# Patient Record
Sex: Male | Born: 1950 | Race: White | Hispanic: No | Marital: Single | Smoking: Former smoker
Health system: Southern US, Community
[De-identification: ages and names within clinical notes are randomized; demographics above are authoritative.]

## PROBLEM LIST (undated history)

## (undated) DIAGNOSIS — I441 Atrioventricular block, second degree: Secondary | ICD-10-CM

## (undated) DIAGNOSIS — E119 Type 2 diabetes mellitus without complications: Secondary | ICD-10-CM

## (undated) DIAGNOSIS — G3184 Mild cognitive impairment, so stated: Secondary | ICD-10-CM

## (undated) DIAGNOSIS — I442 Atrioventricular block, complete: Secondary | ICD-10-CM

## (undated) DIAGNOSIS — E785 Hyperlipidemia, unspecified: Secondary | ICD-10-CM

## (undated) DIAGNOSIS — I471 Supraventricular tachycardia, unspecified: Secondary | ICD-10-CM

## (undated) DIAGNOSIS — I252 Old myocardial infarction: Secondary | ICD-10-CM

## (undated) HISTORY — DX: Old myocardial infarction: I25.2

## (undated) HISTORY — DX: Type 2 diabetes mellitus without complications: E11.9

## (undated) HISTORY — DX: Hyperlipidemia, unspecified: E78.5

## (undated) HISTORY — DX: Atrioventricular block, complete: I44.2

## (undated) HISTORY — DX: Mild cognitive impairment of uncertain or unknown etiology: G31.84

## (undated) HISTORY — DX: Supraventricular tachycardia: I47.1

## (undated) HISTORY — DX: Atrioventricular block, second degree: I44.1

## (undated) HISTORY — DX: Supraventricular tachycardia, unspecified: I47.10

---

## 2006-09-30 DIAGNOSIS — I252 Old myocardial infarction: Secondary | ICD-10-CM

## 2006-09-30 HISTORY — PX: CORONARY ARTERY BYPASS GRAFT: SHX141

## 2006-09-30 HISTORY — DX: Old myocardial infarction: I25.2

## 2006-10-03 ENCOUNTER — Ambulatory Visit: Payer: Self-pay | Admitting: Thoracic Surgery (Cardiothoracic Vascular Surgery)

## 2006-10-03 ENCOUNTER — Inpatient Hospital Stay (HOSPITAL_COMMUNITY): Admission: EM | Admit: 2006-10-03 | Discharge: 2006-10-08 | Payer: Self-pay | Admitting: Emergency Medicine

## 2006-10-03 ENCOUNTER — Ambulatory Visit: Payer: Self-pay | Admitting: Cardiovascular Disease

## 2006-10-03 DIAGNOSIS — I25810 Atherosclerosis of coronary artery bypass graft(s) without angina pectoris: Secondary | ICD-10-CM | POA: Insufficient documentation

## 2006-10-06 ENCOUNTER — Encounter: Payer: Self-pay | Admitting: Cardiovascular Disease

## 2006-10-29 ENCOUNTER — Ambulatory Visit: Payer: Self-pay | Admitting: Cardiovascular Disease

## 2006-11-03 ENCOUNTER — Ambulatory Visit: Payer: Self-pay | Admitting: Thoracic Surgery (Cardiothoracic Vascular Surgery)

## 2007-01-05 ENCOUNTER — Ambulatory Visit: Payer: Self-pay | Admitting: Cardiovascular Disease

## 2007-01-05 LAB — CONVERTED CEMR LAB
ALT: 33 units/L (ref 0–53)
AST: 25 units/L (ref 0–37)
Alkaline Phosphatase: 83 units/L (ref 39–117)
Bilirubin, Direct: 0.1 mg/dL (ref 0.0–0.3)
Cholesterol: 199 mg/dL (ref 0–200)
HDL: 26.3 mg/dL — ABNORMAL LOW (ref >39.0)
VLDL: 36 mg/dL (ref 0–40)

## 2007-01-13 ENCOUNTER — Ambulatory Visit: Payer: Self-pay | Admitting: Cardiovascular Disease

## 2007-04-09 ENCOUNTER — Ambulatory Visit: Payer: Self-pay | Admitting: Cardiovascular Disease

## 2007-04-09 LAB — CONVERTED CEMR LAB
Albumin: 4.3 g/dL (ref 3.5–5.2)
Alkaline Phosphatase: 61 units/L (ref 39–117)
Total Bilirubin: 0.9 mg/dL (ref 0.3–1.2)
Total CHOL/HDL Ratio: 5.7
Triglycerides: 166 mg/dL — ABNORMAL HIGH (ref 0–149)
VLDL: 33 mg/dL (ref 0–40)

## 2007-04-15 ENCOUNTER — Ambulatory Visit: Payer: Self-pay | Admitting: Cardiology

## 2007-07-14 ENCOUNTER — Ambulatory Visit: Payer: Self-pay | Admitting: Cardiovascular Disease

## 2007-07-22 ENCOUNTER — Ambulatory Visit: Payer: Self-pay

## 2008-03-17 IMAGING — CR DG CHEST 2V
2 series · 2 of 2 positions shown · non-contrast
Comparison: 10/06/06.

CLINICAL DATA: 53-year-old with chest pain and shortness of breath.
 CHEST - 2 VIEW:

[w chest pa]
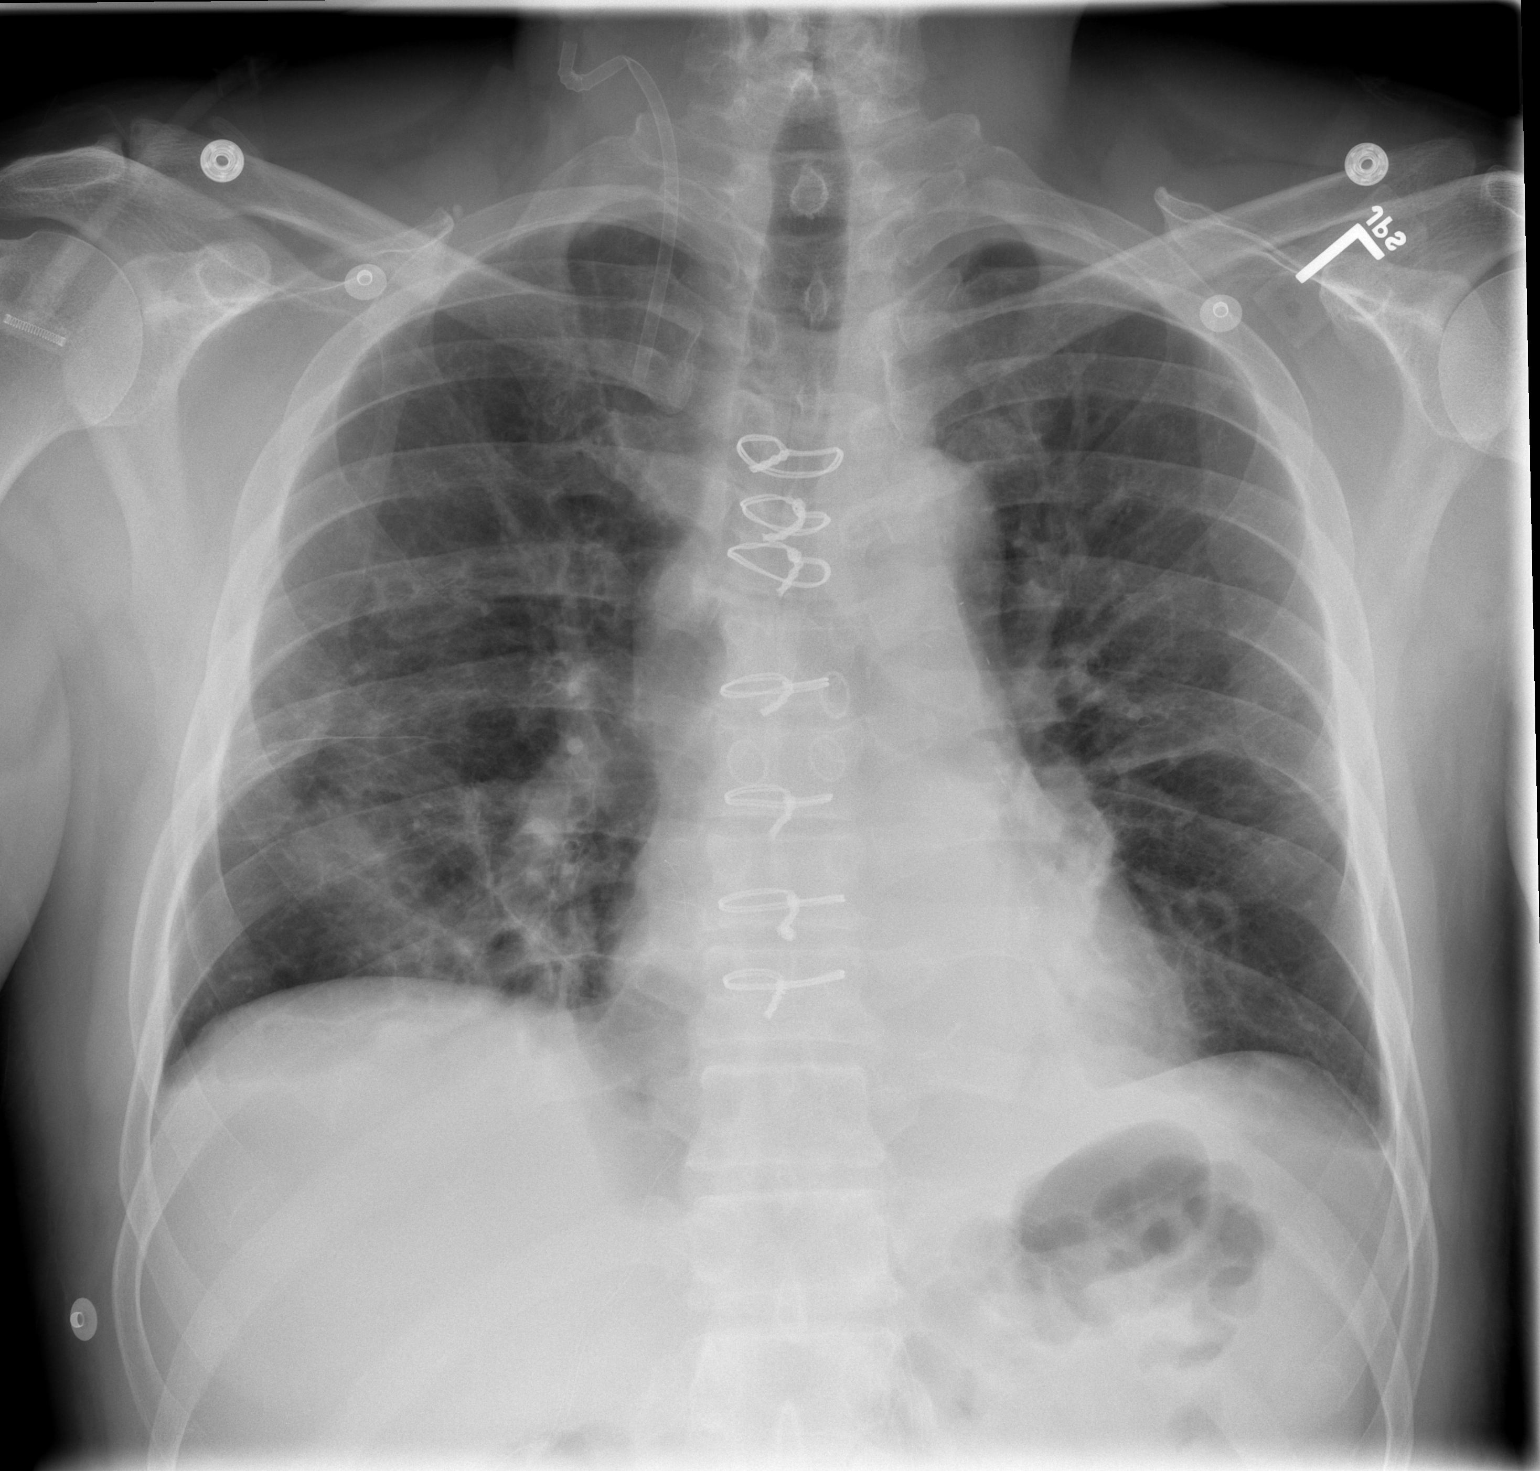

[w chest lat]
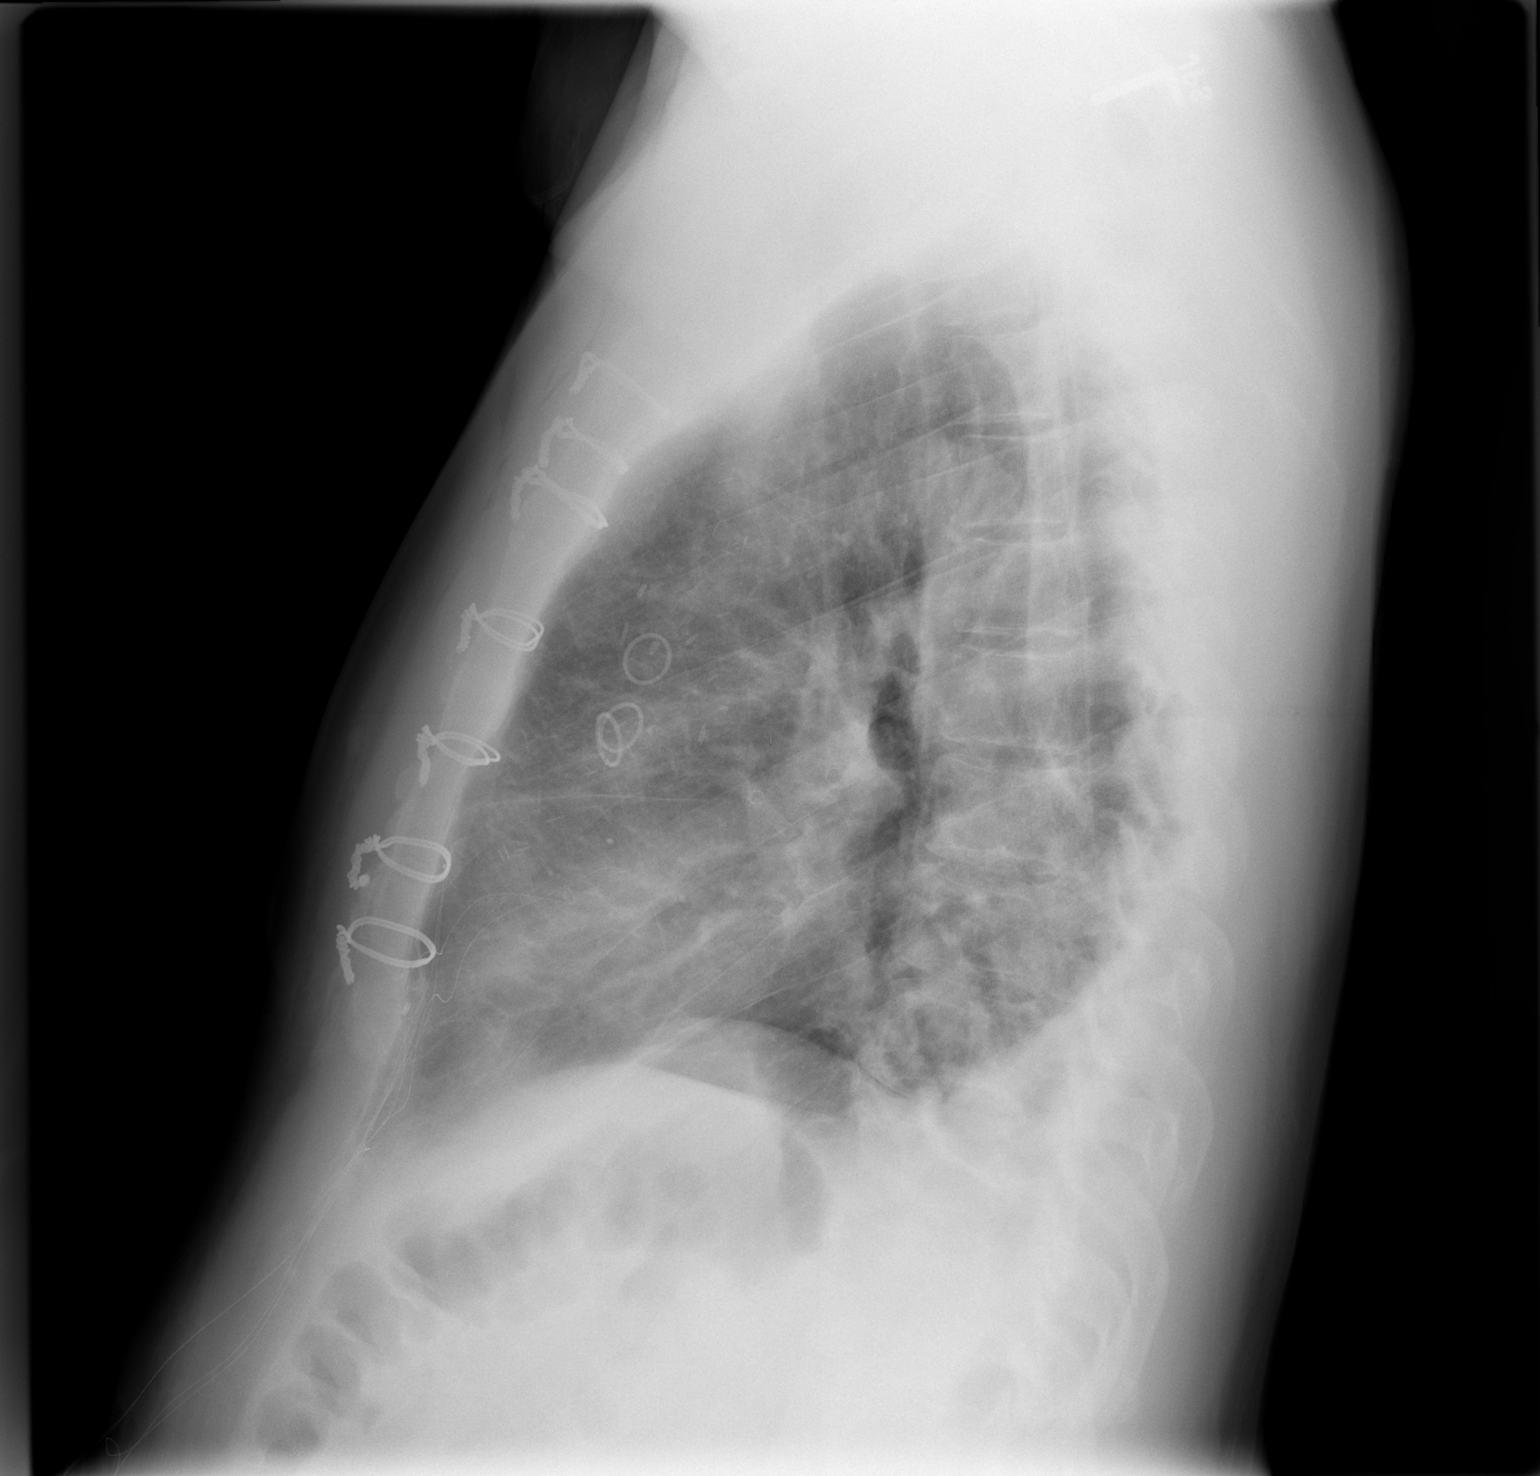

[2 of 2 positions shown; findings below may reference images not displayed]

FINDINGS: Stable surgical change related to bypass surgery.  The right IJ cordis is still in place.  Lungs demonstrate improved aeration with resolving edema and atelectasis.  Small bilateral pleural effusions persist.
IMPRESSION: 1.  Overall, improved aeration. 
 2.  No pneumothorax. 
 3.  Small bilateral effusions.

## 2008-04-04 ENCOUNTER — Ambulatory Visit: Payer: Self-pay | Admitting: Cardiovascular Disease

## 2008-04-11 ENCOUNTER — Ambulatory Visit: Payer: Self-pay | Admitting: Cardiovascular Disease

## 2008-04-11 LAB — CONVERTED CEMR LAB
ALT: 49 units/L (ref 0–53)
Alkaline Phosphatase: 45 units/L (ref 39–117)
Basophils Absolute: 0 10*3/uL (ref 0.0–0.1)
Bilirubin, Direct: 0.2 mg/dL (ref 0.0–0.3)
CO2: 28 meq/L (ref 19–32)
Calcium: 9.1 mg/dL (ref 8.4–10.5)
Cholesterol: 138 mg/dL (ref 0–200)
Glucose, Bld: 126 mg/dL — ABNORMAL HIGH (ref 70–99)
HDL: 20.3 mg/dL — ABNORMAL LOW (ref >39.0)
Hemoglobin: 16.1 g/dL (ref 13.0–17.0)
LDL Cholesterol: 79 mg/dL (ref 0–99)
Lymphocytes Relative: 38.8 % (ref 12.0–46.0)
MCHC: 34.7 g/dL (ref 30.0–36.0)
Monocytes Relative: 14.9 % — ABNORMAL HIGH (ref 3.0–12.0)
Neutro Abs: 2.1 10*3/uL (ref 1.4–7.7)
Neutrophils Relative %: 42.8 % — ABNORMAL LOW (ref 43.0–77.0)
Platelets: 160 10*3/uL (ref 150–400)
Potassium: 4.4 meq/L (ref 3.5–5.1)
RDW: 12.8 % (ref 11.5–14.6)
Sodium: 141 meq/L (ref 135–145)
Total Bilirubin: 0.9 mg/dL (ref 0.3–1.2)
Total CHOL/HDL Ratio: 6.8
Total Protein: 7.1 g/dL (ref 6.0–8.3)
Triglycerides: 194 mg/dL — ABNORMAL HIGH (ref 0–149)
VLDL: 39 mg/dL (ref 0–40)

## 2008-08-08 DIAGNOSIS — I471 Supraventricular tachycardia: Secondary | ICD-10-CM

## 2008-08-08 DIAGNOSIS — E785 Hyperlipidemia, unspecified: Secondary | ICD-10-CM | POA: Insufficient documentation

## 2008-10-27 ENCOUNTER — Telehealth: Payer: Self-pay | Admitting: Cardiovascular Disease

## 2008-12-28 ENCOUNTER — Ambulatory Visit: Payer: Self-pay | Admitting: Cardiovascular Disease

## 2008-12-28 DIAGNOSIS — G3184 Mild cognitive impairment, so stated: Secondary | ICD-10-CM | POA: Insufficient documentation

## 2009-05-04 ENCOUNTER — Telehealth (INDEPENDENT_AMBULATORY_CARE_PROVIDER_SITE_OTHER): Payer: Self-pay | Admitting: *Deleted

## 2009-08-23 ENCOUNTER — Ambulatory Visit: Payer: Self-pay | Admitting: Cardiovascular Disease

## 2009-08-29 LAB — CONVERTED CEMR LAB
ALT: 56 units/L — ABNORMAL HIGH (ref 0–53)
Albumin: 4.3 g/dL (ref 3.5–5.2)
Alkaline Phosphatase: 46 units/L (ref 39–117)
Bilirubin, Direct: 0.1 mg/dL (ref 0.0–0.3)
Total CHOL/HDL Ratio: 4
Total Protein: 7.4 g/dL (ref 6.0–8.3)
Triglycerides: 194 mg/dL — ABNORMAL HIGH (ref 0.0–149.0)

## 2009-09-04 ENCOUNTER — Ambulatory Visit: Payer: Self-pay | Admitting: Cardiovascular Disease

## 2009-09-13 ENCOUNTER — Telehealth (INDEPENDENT_AMBULATORY_CARE_PROVIDER_SITE_OTHER): Payer: Self-pay | Admitting: *Deleted

## 2009-09-18 ENCOUNTER — Ambulatory Visit: Payer: Self-pay

## 2009-09-18 ENCOUNTER — Ambulatory Visit: Payer: Self-pay | Admitting: Internal Medicine

## 2009-09-18 ENCOUNTER — Encounter (HOSPITAL_COMMUNITY): Admission: RE | Admit: 2009-09-18 | Discharge: 2009-11-29 | Payer: Self-pay | Admitting: Cardiovascular Disease

## 2010-03-28 ENCOUNTER — Telehealth: Payer: Self-pay | Admitting: Cardiovascular Disease

## 2010-04-27 ENCOUNTER — Ambulatory Visit: Payer: Self-pay | Admitting: Cardiovascular Disease

## 2010-04-27 LAB — CONVERTED CEMR LAB
AST: 25 units/L (ref 0–37)
Albumin: 4.2 g/dL (ref 3.5–5.2)
Alkaline Phosphatase: 49 units/L (ref 39–117)
Bilirubin, Direct: 0.1 mg/dL (ref 0.0–0.3)
Cholesterol: 117 mg/dL (ref 0–200)
HDL: 23.4 mg/dL — ABNORMAL LOW (ref >39.00)
LDL Cholesterol: 54 mg/dL (ref 0–99)
Total Bilirubin: 1.1 mg/dL (ref 0.3–1.2)
Total CHOL/HDL Ratio: 5
VLDL: 39.6 mg/dL (ref 0.0–40.0)

## 2010-05-03 ENCOUNTER — Encounter: Payer: Self-pay | Admitting: Cardiovascular Disease

## 2010-05-03 ENCOUNTER — Ambulatory Visit: Payer: Self-pay | Admitting: Cardiovascular Disease

## 2010-07-30 ENCOUNTER — Telehealth: Payer: Self-pay | Admitting: Cardiovascular Disease

## 2010-07-30 ENCOUNTER — Inpatient Hospital Stay (HOSPITAL_COMMUNITY)
Admission: EM | Admit: 2010-07-30 | Discharge: 2010-08-01 | DRG: 243 | Disposition: A | Discharge status: Home / Self Care | Payer: Self-pay | Attending: Cardiovascular Disease | Admitting: Cardiovascular Disease

## 2010-07-30 DIAGNOSIS — E785 Hyperlipidemia, unspecified: Secondary | ICD-10-CM | POA: Diagnosis present

## 2010-07-30 DIAGNOSIS — I252 Old myocardial infarction: Secondary | ICD-10-CM

## 2010-07-30 DIAGNOSIS — I2510 Atherosclerotic heart disease of native coronary artery without angina pectoris: Secondary | ICD-10-CM | POA: Diagnosis present

## 2010-07-30 DIAGNOSIS — Z8674 Personal history of sudden cardiac arrest: Secondary | ICD-10-CM

## 2010-07-30 DIAGNOSIS — I441 Atrioventricular block, second degree: Principal | ICD-10-CM | POA: Diagnosis present

## 2010-07-30 DIAGNOSIS — I442 Atrioventricular block, complete: Secondary | ICD-10-CM | POA: Diagnosis present

## 2010-07-30 DIAGNOSIS — E119 Type 2 diabetes mellitus without complications: Secondary | ICD-10-CM | POA: Diagnosis present

## 2010-07-30 DIAGNOSIS — I471 Supraventricular tachycardia, unspecified: Secondary | ICD-10-CM | POA: Diagnosis present

## 2010-07-30 DIAGNOSIS — I498 Other specified cardiac arrhythmias: Secondary | ICD-10-CM | POA: Diagnosis present

## 2010-07-30 DIAGNOSIS — Z87891 Personal history of nicotine dependence: Secondary | ICD-10-CM

## 2010-07-30 DIAGNOSIS — I200 Unstable angina: Secondary | ICD-10-CM | POA: Diagnosis present

## 2010-07-30 DIAGNOSIS — Z91038 Other insect allergy status: Secondary | ICD-10-CM

## 2010-07-30 DIAGNOSIS — Z951 Presence of aortocoronary bypass graft: Secondary | ICD-10-CM

## 2010-07-30 DIAGNOSIS — Z7982 Long term (current) use of aspirin: Secondary | ICD-10-CM

## 2010-07-30 DIAGNOSIS — I452 Bifascicular block: Secondary | ICD-10-CM | POA: Diagnosis present

## 2010-07-30 DIAGNOSIS — I10 Essential (primary) hypertension: Secondary | ICD-10-CM | POA: Diagnosis present

## 2010-07-30 LAB — BASIC METABOLIC PANEL
BUN: 16 mg/dL (ref 6–23)
Calcium: 10 mg/dL (ref 8.4–10.5)
Creatinine, Ser: 1.07 mg/dL (ref 0.4–1.5)

## 2010-07-30 LAB — CBC
HCT: 48.9 % (ref 39.0–52.0)
MCH: 30.3 pg (ref 26.0–34.0)
MCHC: 35.8 g/dL (ref 30.0–36.0)
MCV: 84.6 fL (ref 78.0–100.0)
Platelets: 135 10*3/uL — ABNORMAL LOW (ref 150–400)
RDW: 12.6 % (ref 11.5–15.5)

## 2010-07-30 LAB — TROPONIN I

## 2010-07-30 LAB — DIFFERENTIAL
Eosinophils Absolute: 0.1 10*3/uL (ref 0.0–0.7)
Eosinophils Relative: 2 % (ref 0–5)
Lymphs Abs: 1.6 10*3/uL (ref 0.7–4.0)
Monocytes Absolute: 0.6 10*3/uL (ref 0.1–1.0)
Monocytes Relative: 13 % — ABNORMAL HIGH (ref 3–12)

## 2010-07-30 LAB — PROTIME-INR: Prothrombin Time: 15.2 seconds (ref 11.6–15.2)

## 2010-07-30 LAB — HEMOGLOBIN A1C: Mean Plasma Glucose: 335 mg/dL — ABNORMAL HIGH (ref <117)

## 2010-07-30 LAB — COMPREHENSIVE METABOLIC PANEL
AST: 37 U/L (ref 0–37)
Alkaline Phosphatase: 56 U/L (ref 39–117)
BUN: 15 mg/dL (ref 6–23)
BUN: 14 mg/dL (ref 6–23)
CO2: 27 mEq/L (ref 19–32)
CO2: 27 mEq/L (ref 19–32)
Calcium: 9.6 mg/dL (ref 8.4–10.5)
Calcium: 9.8 mg/dL (ref 8.4–10.5)
Chloride: 96 mEq/L (ref 96–112)
Creatinine, Ser: 1.11 mg/dL (ref 0.4–1.5)
GFR calc non Af Amer: >60 mL/min (ref >60)
GFR calc non Af Amer: >60 mL/min (ref >60)
Glucose, Bld: 315 mg/dL — ABNORMAL HIGH (ref 70–99)
Total Bilirubin: 0.7 mg/dL (ref 0.3–1.2)
Total Protein: 7 g/dL (ref 6.0–8.3)

## 2010-07-30 LAB — CK TOTAL AND CKMB (NOT AT ARMC): Total CK: 311 U/L — ABNORMAL HIGH (ref 7–232)

## 2010-07-30 LAB — TSH: TSH: 2.162 u[IU]/mL (ref 0.350–4.500)

## 2010-07-31 HISTORY — PX: PACEMAKER INSERTION: SHX728

## 2010-07-31 LAB — GLUCOSE, CAPILLARY: Glucose-Capillary: 321 mg/dL — ABNORMAL HIGH (ref 70–99)

## 2010-07-31 LAB — LIPID PANEL
HDL: 24 mg/dL — ABNORMAL LOW (ref >39)
Total CHOL/HDL Ratio: 5.5 RATIO
Triglycerides: 342 mg/dL — ABNORMAL HIGH (ref <150)
VLDL: 68 mg/dL — ABNORMAL HIGH (ref 0–40)

## 2010-07-31 LAB — CARDIAC PANEL(CRET KIN+CKTOT+MB+TROPI)

## 2010-07-31 NOTE — Assessment & Plan Note (Signed)
Summary: f3m   Visit Type:  6 months follow up Primary Provider:  none  CC:  Chest pains-Sob-Heart flutter- Tiredness- Bilateral leg pain.  History of Present Illness: 60 year-old male with hx CAD s/p CABG following an acute MI in 2008.  He has several compliants today, but overall he has not felt as well lately and feels a slow decline over the past several months. He is having a great deal of difficulty with sleep - typically sleeping only a few hours at a time. Also complains of shortness of breath with activity.l Denies edema, orthopnea, or PND. No chest pain. does c/o some palpitations.   Current Medications (verified): 1)  Metoprolol Succinate 50 Mg Xr24h-Tab (Metoprolol Succinate) .... Take 1 Tablet By Mouth Once  A Day 2)  Simvastatin 40 Mg Tabs (Simvastatin) .... Take 1 Tablet By Mouth At Bedtime 3)  Aspirin Ec 325 Mg Tbec (Aspirin) .... Take One Tablet By Mouth Daily 4)  Advil 200 Mg Tabs (Ibuprofen) .... As Needed 5)  Vitamin C 500 Mg Tabs (Ascorbic Acid) .... Take 1 Tablet By Mouth Once A Day  Allergies (verified): 1)  ! * Bee Stings  Past History:  Past medical history reviewed for relevance to current acute and chronic problems.  Past Medical History: Reviewed history from 08/08/2008 and no changes required. acute myocardial infarction complicated by cardiac arrest April 2008, treated with emergency PCI followed by multivessel coronary bypass surgery. Dyslipidemia paroxysmal supraventricular tachycardia  Review of Systems       Negative except as per HPI   Vital Signs:  Patient profile:   60 year old male Height:      72 inches Weight:      211.50 pounds BMI:     28.79 Pulse rate:   64 / minute Pulse rhythm:   irregular Resp:     20 per minute BP sitting:   124 / 70  (left arm) Cuff size:   large  Vitals Entered By: Vikki Ports (September 04, 2009 3:27 PM)  Physical Exam  General:  Pt is alert and oriented, in no acute distress. HEENT: normal Neck:  normal carotid upstrokes without bruits, JVP normal Lungs: CTA CV: RRR without murmur or gallop Abd: soft, NT, positive BS, no bruit, no organomegaly Ext: no clubbing, cyanosis, or edema. peripheral pulses 2+ and equal Skin: warm and dry without rash    EKG  Procedure date:  09/04/2009  Findings:      NSR with RBBB, LAFB, HR 64 bpm.  Impression & Recommendations:  Problem # 1:  CAD, ARTERY BYPASS GRAFT (ICD-414.04) Pt with progressiv dyspnea. No obvious ischemic symptoms or change in EKG pattern, but he is high-risk (left main stent and emergency CABG at presentation). Recommend exercise myoview stress test to evaluate for inducible ischemia and rule out dyspnea as an ischemic equivalent.   His updated medication list for this problem includes:    Metoprolol Succinate 50 Mg Xr24h-tab (Metoprolol succinate) .Marland Kitchen... Take 1 tablet by mouth once  a day    Aspirin Ec 325 Mg Tbec (Aspirin) .Marland Kitchen... Take one tablet by mouth daily  Orders: EKG w/ Interpretation (93000) Nuclear Stress Test (Nuc Stress Test)  Problem # 2:  HYPERLIPIDEMIA-MIXED (ICD-272.4) Lipids at goal on current Rx.  HDL is low. His updated medication list for this problem includes:    Simvastatin 40 Mg Tabs (Simvastatin) .Marland Kitchen... Take 1 tablet by mouth at bedtime  Orders: EKG w/ Interpretation (93000) Nuclear Stress Test (Nuc Stress Test)  CHOL: 117 (08/23/2009)   LDL: 49 (08/23/2009)   HDL: 28.90 (08/23/2009)   TG: 194.0 (08/23/2009)  Patient Instructions: 1)  Your physician recommends that you continue on your current medications as directed. Please refer to the Current Medication list given to you today. 2)  Your physician has requested that you have an exercise stress myoview.  For further information please visit [email or web address]  Please follow instruction sheet, as given. 3)  Your physician wants you to follow-up in: 6 MONTHS.  You will receive a reminder letter in the mail two months in advance. If you don't  receive a letter, please call our office to schedule the follow-up appointment.

## 2010-07-31 NOTE — Progress Notes (Signed)
Summary: blood work prior to appt   Phone Note Call from Patient Call back at [facility] Phone [phone]   Caller: Spouse Reason for Call: Talk to Nurse Summary of Call: pt would like to have blood work prior to appt.  Initial call taken by: Lorne Skeens,  March 28, 2010 10:01 AM  Follow-up for Phone Call        This pt can have a lipid and liver profile checked prior to his appt with Dr Excell Seltzer. Dx: 414.04, 272.4, 427.0.  Message sent to Lela to contact the pt and arrange lab appt.  Follow-up by: Julieta Gutting, RN, BSN,  March 28, 2010 10:26 AM

## 2010-07-31 NOTE — Assessment & Plan Note (Signed)
Summary: Cardiology Nuclear Study  Nuclear Med Background Indications for Stress Test: Evaluation for Ischemia   History: Abnormal EKG, Angioplasty, CABG, Echo, Heart Catheterization, Myocardial Infarction, Myocardial Perfusion Study, Stents  History Comments: 04/08 MI with Cardiac Arrest LM '08 Heart Cath - Stent/Angioplasty '08 CABG LM '08 ECHO EF 45-50% 07/22/07 MPS EF 64% NL PSVT, PAT, SVT  Symptoms: Chest Pain, DOE, Fatigue, Palpitations, SOB    Nuclear Pre-Procedure Cardiac Risk Factors: Hypertension, Lipids, RBBB Caffeine/Decaff Intake: None NPO After: 8:00 PM Lungs: clear IV 0.9% NS with Angio Cath: 20g     IV Site: (R) AC IV Started by: Irean Hong RN Chest Size (in) 46     Height (in): 72 Weight (lb): 207 BMI: 28.18 Tech Comments: The patient complaining of back strain today, and unable to walk fast on treadmill, changed to lexiscan. Patsy Edwards,RN. Dr. Shirlee Latch was consulted on this patient, he went into a 2nd degree avb with infusion. The patient states the symptoms he experienced with the 2nd degree avb is what he feels when he is exerting himself at home. Dr.McLean advised for him to be set up for a 48 hr. monitor to see if he goes into the same rhythm. He also needs to follow up with Dr. Excell Seltzer since he is the patient's physician.  The monitor was arranged for the patient. After further discussion, the patient decided that financially it just wasn't possible since he doesn't have insurance. I will follow up with Dr. Excell Seltzer for further instructions.  Nuclear Med Study 1 or 2 day study:  1 day     Stress Test Type:  Eugenie Birks Reading MD:  Dietrich Pates, MD     Referring MD:  M.Cooper Resting Radionuclide:  Technetium 72m Tetrofosmin     Resting Radionuclide Dose:  10.4 mCi  Stress Radionuclide:  Technetium 38m Tetrofosmin     Stress Radionuclide Dose:  33 mCi   Stress Protocol   Lexiscan: 0.4 mg   Stress Test Technologist:  Milana Na EMT-P     Nuclear  Technologist:  Domenic Polite CNMT  Rest Procedure  Myocardial perfusion imaging was performed at rest 45 minutes following the intravenous administration of Myoview Technetium 25m Tetrofosmin.  Stress Procedure  The patient received IV Lexiscan 0.4 mg over 15-seconds.  Myoview injected at 30-seconds.  There were no significant changes, but the patient went into a 2nd degree avb type II with infusion.  Quantitative spect images were obtained after a 45 minute delay.  QPS Raw Data Images:  Soft tissue (diaphragm) underlies heart. Stress Images:  Thinning with decreased counts in the nfero  wall (base, mid, dstal) and apex.  Otherwise normal perfsuion Rest Images:  Inferior changes are more prominent Subtraction (SDS):  No significant ischemia. Transient Ischemic Dilatation:  1.02  (Normal <1.22)  Lung/Heart Ratio:  .41  (Normal <0.45)  Quantitative Gated Spect Images QGS EDV:  108 ml QGS ESV:  36 ml QGS EF:  66 %   Overall Impression  Exercise Capacity: Lexiscan protocol BP Response: Normal blood pressure response. Clinical Symptoms: Mild chest pain/dyspnea. ECG Impression: No significant ST segment change suggestive of ischemia. Overall Impression: Probable normal perfusion and soft tissue attenuation (diaphragm)  No evid for significant ischemia.  Appended Document: Cardiology Nuclear Study**Review with [facility]** no significant ischemia. Will discuss his rhythm issues with him.

## 2010-07-31 NOTE — Progress Notes (Signed)
Summary: Nuclear Pre-Procedure  Phone Note Outgoing Call   Call placed by: Milana Na, EMT-P,  September 13, 2009 9:00 AM Summary of Call: Left message with information on Myoview Information Sheet (see scanned document for details). The patient's AM is full, si a call back number was left.     Nuclear Med Background Indications for Stress Test: Evaluation for Ischemia   History: Abnormal EKG, Angioplasty, CABG, Echo, Heart Catheterization, Myocardial Infarction, Myocardial Perfusion Study, Stents  History Comments: 04/08 MI with Cardiac Arrest LM '08 Heart Cath - Stent/Angioplasty '08 CABG LM '08 ECHO EF 45-50% 07/22/07 MPS EF 64% NL PSVT, PAT, SVT  Symptoms: Chest Pain, DOE, Fatigue, Palpitations, SOB    Nuclear Pre-Procedure Cardiac Risk Factors: Hypertension, Lipids, RBBB Height (in): 72  Nuclear Med Study Referring MD:  M.Cooper

## 2010-07-31 NOTE — Assessment & Plan Note (Signed)
Summary: f76m   Visit Type:  6 months follow up Primary Provider:  none  CC:  Chest pains.  History of Present Illness: 60 year-old male with hx CAD s/p CABG following an acute MI in 2008. He complained of chest pain at his last office visit and underwent a Myoview stress scan showing no significant ischemia.   He complains of postural lightheadedness when first standing. No syncope. Also has spells of chest pain and feeling a pressure/'heartbeat' in his neck. These last approximately 60-90 seconds and then resolve spontaneously. He has a hx of pSVT but these symptoms feel different. No exertional chest pain or dyspnea. No edema or other complaints.  Current Medications (verified): 1)  Metoprolol Succinate 50 Mg Xr24h-Tab (Metoprolol Succinate) .... Take 1 Tablet By Mouth Once  A Day 2)  Simvastatin 40 Mg Tabs (Simvastatin) .... Take 1 Tablet By Mouth At Bedtime 3)  Aspirin Ec 325 Mg Tbec (Aspirin) .... Take One Tablet By Mouth Daily 4)  Advil 200 Mg Tabs (Ibuprofen) .... As Needed  Allergies: 1)  ! * Bee Stings  Past History:  Past medical history reviewed for relevance to current acute and chronic problems.  Past Medical History: Reviewed history from 08/08/2008 and no changes required. acute myocardial infarction complicated by cardiac arrest April 2008, treated with emergency PCI followed by multivessel coronary bypass surgery. Dyslipidemia paroxysmal supraventricular tachycardia  Review of Systems       Negative except as per HPI   Vital Signs:  Patient profile:   60 year old male Height:      72 inches Weight:      200.75 pounds BMI:     27.33 Pulse rate:   64 / minute Pulse rhythm:   irregular Resp:     18 per minute BP sitting:   111 / 61  (left arm) Cuff size:   large  Vitals Entered By: Vikki Ports (May 03, 2010 12:06 PM)  Physical Exam  General:  Pt is alert and oriented, in no acute distress. HEENT: normal Neck: normal carotid upstrokes without  bruits, JVP normal Lungs: CTA CV: RRR without murmur or gallop Abd: soft, NT, positive BS, no bruit, no organomegaly Ext: no clubbing, cyanosis, or edema. peripheral pulses 2+ and equal Skin: warm and dry without rash    Nuclear Study  Procedure date:  09/18/2009  Findings:      QPS  Raw Data Images:  Soft tissue (diaphragm) underlies heart. Stress Images:  Thinning with decreased counts in the nfero  wall (base, mid, dstal) and apex.  Otherwise normal perfsuion Rest Images:  Inferior changes are more prominent Subtraction (SDS):  No significant ischemia. Transient Ischemic Dilatation:  1.02  (Normal <1.22)  Lung/Heart Ratio:  .41  (Normal <0.45)  Quantitative Gated Spect Images  QGS EDV:  108 ml QGS ESV:  36 ml QGS EF:  66 %   Overall Impression   Exercise Capacity: Lexiscan protocol BP Response: Normal blood pressure response. Clinical Symptoms: Mild chest pain/dyspnea. ECG Impression: No significant ST segment change suggestive of ischemia. Overall Impression: Probable normal perfusion and soft tissue attenuation (diaphragm)  No evid for significant ischemia.    EKG  Procedure date:  05/03/2010  Findings:      NSR with RBBB 64 bpm, LAFB  Impression & Recommendations:  Problem # 1:  CAD, ARTERY BYPASS GRAFT (ICD-414.04) Stable without exertional angina. His chest pain episodes are atypical and could be related to arrhythmia. Would continue current meds with metoprolol, ASA,  and a statin. Low-risk Myoview result noted from earlier this year and symptoms unchanged since that time.  His updated medication list for this problem includes:    Metoprolol Succinate 50 Mg Xr24h-tab (Metoprolol succinate) .Marland Kitchen... Take 1 tablet by mouth once  a day    Aspirin Ec 325 Mg Tbec (Aspirin) .Marland Kitchen... Take one tablet by mouth daily  Orders: EKG w/ Interpretation (93000)  Problem # 2:  SVT/ PSVT/ PAT (ICD-427.0) Discussed event recorder but he declines secondary to cost.  His  updated medication list for this problem includes:    Metoprolol Succinate 50 Mg Xr24h-tab (Metoprolol succinate) .Marland Kitchen... Take 1 tablet by mouth once  a day    Aspirin Ec 325 Mg Tbec (Aspirin) .Marland Kitchen... Take one tablet by mouth daily  Problem # 3:  HYPERLIPIDEMIA-MIXED (ICD-272.4) LDL at goal, low HDL noted. Continue current Rx.  His updated medication list for this problem includes:    Simvastatin 40 Mg Tabs (Simvastatin) .Marland Kitchen... Take 1 tablet by mouth at bedtime  CHOL: 117 (04/27/2010)   LDL: 54 (04/27/2010)   HDL: 23.40 (04/27/2010)   TG: 198.0 (04/27/2010)  Patient Instructions: 1)  Your physician recommends that you continue on your current medications as directed. Please refer to the Current Medication list given to you today. 2)  Your physician wants you to follow-up in: 6 MONTHS.  You will receive a reminder letter in the mail two months in advance. If you don't receive a letter, please call our office to schedule the follow-up appointment.

## 2010-08-01 ENCOUNTER — Inpatient Hospital Stay (HOSPITAL_COMMUNITY): Payer: Self-pay

## 2010-08-01 DIAGNOSIS — I441 Atrioventricular block, second degree: Secondary | ICD-10-CM

## 2010-08-01 LAB — CBC
Hemoglobin: 17.3 g/dL — ABNORMAL HIGH (ref 13.0–17.0)
MCH: 29.9 pg (ref 26.0–34.0)
RBC: 5.79 MIL/uL (ref 4.22–5.81)
WBC: 6.9 10*3/uL (ref 4.0–10.5)

## 2010-08-01 LAB — BASIC METABOLIC PANEL
CO2: 24 mEq/L (ref 19–32)
Chloride: 100 mEq/L (ref 96–112)
Creatinine, Ser: 1.07 mg/dL (ref 0.4–1.5)
GFR calc Af Amer: >60 mL/min (ref >60)
Potassium: 3.7 mEq/L (ref 3.5–5.1)

## 2010-08-01 LAB — GLUCOSE, CAPILLARY
Glucose-Capillary: 313 mg/dL — ABNORMAL HIGH (ref 70–99)
Glucose-Capillary: 225 mg/dL — ABNORMAL HIGH (ref 70–99)
Glucose-Capillary: 325 mg/dL — ABNORMAL HIGH (ref 70–99)

## 2010-08-03 NOTE — Procedures (Signed)
NAME:  Mitchell Blair, Mitchell Blair NO.:  [id number]  MEDICAL RECORD NO.:  [id number]          PATIENT TYPE:  INP  LOCATION:  2807                         FACILITY:  [facility]  PHYSICIAN:  Haron M. Sw[place]iland, M.D.  DATE OF BIRTH:  01/04/1951  DATE OF PROCEDURE:  07/31/2010 DATE OF DISCHARGE:                           CARDIAC CATHETERIZATION   INDICATIONS FOR PROCEDURE:  A 60 year old white male with prior history of cardiac arrest and emergent coronary bypass surgery in 2008.  He now presents with presyncopal symptoms.  His resting ECG demonstrates sinus bradycardia with a long first-degree AV block, right bundle-branch block, and left anterior fascicular block.  PROCEDURE:  Left heart catheterization, coronary and left ventricular angiography, saphenous vein graft angiography x3, and LIMA graft angiography.  Accesses via the right femoral artery using the standard Seldinger technique.  EQUIPMENT:  5-French 4 cm right and left Judkins catheters, 5-French pigtail catheter, 5-French LIMA catheter, and 5-French left Amplatz-1 catheter.  MEDICATIONS:  Local anesthesia, 1% Xylocaine; Versed 2 mg IV, fentanyl 25 mcg IV.  CONTRAST:  125 mL of Omnipaque.  HEMODYNAMIC DATA:  Aortic pressure is 124/73 with a mean of 93 mmHg, left ventricular pressure is 128 with an EDP of 12 mmHg.  COMPLICATIONS:  During the procedure, patient developed spontaneous episodes of 2:1 heart block with heart rates of 38-40 beats per minute. During placement of pigtail catheter into the left ventricle, he developed asystole with ventricular escape complexes.  At the end of the procedure, he was back in sinus rhythm with 1:1 conduction.  ANGIOGRAPHIC DATA:  The left coronary rises and distributes normally. There is a stent in the left main coronary artery.  The distal left main has an 80% to 90% stenosis at the distal bifurcation, involving the LAD ostium.  The left anterior descending artery has  competitive flow from the LIMA.  The left circumflex coronary demonstrates diffuse 80% to 90% stenosis in a large obtuse marginal vessel.  The terminal circumflex is without significant disease.  The right coronary artery demonstrates diffuse 99% disease in the midvessel.  The saphenous vein graft to the PDA is patent.  Saphenous vein graft to the first diagonal was widely patent.  The saphenous vein graft to the obtuse marginal vessel is widely patent.  The LIMA graft to the LAD is widely patent.  Left ventricular angiography performed in the RAO view demonstrates normal left ventricular size and contractility with ejection fraction of 60%.  There are no regional wall motion abnormalities.  FINAL INTERPRETATION: 1. Severe three-vessel obstructive atherosclerotic coronary artery     disease. 2. All grafts were patent including saphenous vein graft to the PDA,     saphenous vein graft to the diagonal, saphenous vein graft to the     obtuse marginal vessel, and LIMA graft to the LAD. 3. Normal left ventricular function. 4. Advanced conduction system disease with intermittent third-degree     heart block.  PLAN:  Recommend placement of permanent transvenous pacemaker.          ______________________________ Jp M. Swaziland, M.D.     PMJ/MEDQ  D:  07/31/2010  T:  08/01/2010  Job:  [id number]  cc:   Veverly Fells. Excell Seltzer, MD  Electronically Signed by Kody Swaziland M.D. on 08/03/2010 04:00:24 PM

## 2010-08-06 NOTE — H&P (Signed)
NAME:  Mitchell Blair, Mitchell Blair NO.:  [id number]  MEDICAL RECORD NO.:  [id number]          PATIENT TYPE:  INP  LOCATION:  2019                         FACILITY:  [facility]  PHYSICIAN:  Verne Carrow, MDDATE OF BIRTH:  1951/05/26  DATE OF ADMISSION:  07/30/2010 DATE OF DISCHARGE:                             HISTORY & PHYSICAL   PRIMARY CARDIOLOGIST:  Veverly Fells. Excell Seltzer, MD  PATIENT PROFILE:  A 60 year old male with prior history of coronary artery disease status post VF arrest and 4-vessel bypass in April 2008, who presents with progressive presyncope and syncope as well as chest pain.  PROBLEM LIST: 1. Unstable angina/coronary artery disease.     a.     Status post acute MI and ventricular fibrillation arrest on      October 03, 2006, complicated by cardiogenic shock.  The patient      underwent PTCA and thrombectomy of the left main and subsequently      CABG x4 with placement of the LIMA to the LAD, vein graft to the      first diagonal, vein graft to the obtuse marginal, and vein graft      to the right coronary artery.     b.     September 18, 2009, negative Myoview, EF 66%. 2. Hyperlipidemia. 3. History of palpitations and paroxysmal supraventricular tachycardia     status post radiofrequency catheter ablation in 2000. 4. History of presyncope and syncope.     a.     The patient declined an event monitor in November 2011,      because he does not have insurance. 5. Right bundle-branch block.  ALLERGIES:  BEESTINGS.  HISTORY OF PRESENT ILLNESS:  A 60 year old male with the above problem list.  The patient is status post VF arrest in April 2008, and underwent catheterization at that time, revealing significant left main disease. His course was complicated by cardiogenic shock, requiring CPR and intraaortic balloon pump placement.  His left main was treated with balloon angioplasty and thrombectomy and then the patient was taken to emergent CABG on the same day.   He has had somewhat remarkable recovery given the events of his arrest.  Over the years, occurring about once a month, he has had episodes of chest pain followed by lightheadedness and tunnel vision as well as a sensation of his heart stopping.  These symptoms were often associated with shortness of breath and nausea and the patient experiences fairly significant presyncope which he tries to prevent by slumping to his knees.  At times, he will have a full out syncopal spell where he loses consciousness for a few seconds and remains groggy for about 30-40 seconds.  Over the past 2 weeks, syncope has recurred on 4 occasions, the first occurring about 2 weeks ago when the patient was on the treadmill and felt sudden onset of chest pain with dyspnea, then tunnel vision, and then loss of consciousness.  From the time of chest pain to syncope, only about 5 seconds past as far as the patient could tell.  On that particular day, the patient fell onto the treadmill and was ejected  off it.  He came to almost immediately and remained groggy for about 30-40 seconds afterwards.  Since then, he has had multiple presyncopal spells occurring about 5 times a day, all being initiated with chest pain and again followed by dyspnea, nausea, and lightheadedness.  Today, the patient was sitting at his computer and had sudden onset of chest pain followed by dyspnea and nausea as well as diaphoresis and then within about 5 seconds, he had lost consciousness and struck the keyboard of his computer with his head.  His wife was nearby and attended to him.  He was only out for a few seconds and remained groggy for about 30-40 seconds once again.  Later this morning, he had a second episode while sitting on a barstool although this time he did not lose consciousness.  Because of progression of symptoms, the patient presented to the [facility] ED where he is currently symptom free and has no complaints.  His vital  signs were stable and ECG shows no acute changes.  Lab work is pending.  HOME MEDICATIONS: 1. Toprol-XL 50 mg daily. 2. Simvastatin 40 mg at bedtime. 3. Aspirin 325 mg daily. 4. Advil 200 mg p.r.n.  FAMILY HISTORY:  He believes his parents are deceased although he has no contact with them.  He had a sister who died in 10-Nov-2004 of cancer.  SOCIAL HISTORY:  The patient lives in [place] with his wife.  He does not work.  He has a 50 plus pack year history, tobacco abuse, quitting in April 2008.  He rarely drinks alcohol, although he does brew his own beer.  He denies drug use.  REVIEW OF SYSTEMS:  Notable for chest pain, dyspnea, presyncope, syncope, nausea, and diaphoresis, all outlined in the HPI.  He is a full code.  Otherwise, all systems reviewed and negative.  PHYSICAL EXAMINATION:  VITAL SIGNS:  Temperature 97.9, heart rate 59, respirations 16, blood pressure 150/83, pulse ox 98% room air. GENERAL:  Pleasant white male, in no acute distress.  Awake, alert, and oriented x3.  Normal affect. HEENT:  Normal. NEUROLOGIC:  Grossly intact.  Nonfocal. SKIN:  Warm and dry without lesions or masses. NECK:  Supple without bruits or JVD. LUNGS:  Respirations are regular and unlabored.  Clear to auscultation. CARDIAC:  Regular S1 and S2.  No S3, S4, or murmurs. ABDOMEN:  Round, soft, nontender, and nondistended.  Bowel sounds present. EXTREMITIES:  Warm, dry, and pink.  No clubbing, cyanosis, or edema. Dorsalis pedis and posterior tibial pulses 2+ equal bilaterally.  No femoral bruits noted.  Chest x-ray is pending.  EKG shows sinus rhythm, first-degree AV block, rate at 63, right bundle-branch block, and left anterior fascicular block.  No acute ST-T changes.  Lab work is pending.  ASSESSMENT AND PLAN: 1. Syncope/presyncope.  Symptoms concerning for arrhythmia in the     setting of chest pain and possible angina/ischemia.  Plan to admit     and cycle enzymes.  Continue home  medications and monitor telemetry     closely.  Plan catheterization in the a.m. to rule out obstructive     coronary disease which might be triggering arrhythmia. 2. Unstable angina/coronary artery disease as above, the patient has     been having chest discomfort concerning for angina.  Continue     aspirin, beta-blocker, statin.  Cath in a.m. 3. Hyperlipidemia.  Check lipids, LFTs.  Continue simvastatin. 4. Hypertension, blood pressure is up here although the patient says  he did not take his medications yet today.  Resume home meds and     follow.     Nicolasa Ducking, ANP   ______________________________ Verne Carrow, MD    CB/MEDQ  D:  07/30/2010  T:  07/31/2010  Job:  [id number]  Electronically Signed by Nicolasa Ducking ANP on 08/03/2010 04:25:37 PM Electronically Signed by Verne Carrow MD on 08/06/2010 09:14:35 AM

## 2010-08-07 ENCOUNTER — Encounter: Payer: Self-pay | Admitting: Internal Medicine

## 2010-08-08 NOTE — Progress Notes (Signed)
Summary: Blacking out and not aware whats going on around him  Phone Note Call from Patient Call back at Home Phone [phone] Call back at [phone]   Caller: [facility] Summary of Call: Pt have been blacking out and not clear about whats going on around him at times Initial call taken by: Judie Grieve,  July 30, 2010 11:26 AM  Follow-up for Phone Call        I spoke with the pt's wife and she said the pt started passing out last week.  The pt had a spell this morning while sitting on a stool and all of the color drained from his face.  The pt did not go completely out but she had to hold him on stool until spell passed. I spoke with the pt and he passed out a couple of times last week and has had two spells of pre-syncope this morning. The pt can feel these spells coming on.  The pt gets pressure in chest, SOB and tunnel vision prior to passing out.  The pt will drop to his knees and he feels his heart stop and then he wakes up on the floor and his pulse is slow. This all occurs within about 30 seconds.  The pt can hear during these spells but he gets disoriented.  At this time I instructed the pt that he should seek further evaluation at the ER.  The pt does not drive and he will have his wife take him to the [facility] ER.  The pt will arrive around 2 because he needed to take care of a few things at home. I asked him to call EMS if he had another spell prior to coming to the ER. Trish aware.  Follow-up by: Julieta Gutting, RN, BSN,  July 30, 2010 11:43 AM

## 2010-08-13 ENCOUNTER — Encounter: Payer: Self-pay | Admitting: Internal Medicine

## 2010-08-13 ENCOUNTER — Ambulatory Visit (INDEPENDENT_AMBULATORY_CARE_PROVIDER_SITE_OTHER): Payer: Self-pay

## 2010-08-13 DIAGNOSIS — I441 Atrioventricular block, second degree: Secondary | ICD-10-CM

## 2010-08-15 NOTE — Discharge Summary (Signed)
NAME:  Mitchell Blair, Mitchell Blair NO.:  [id number]  MEDICAL RECORD NO.:  [id number]           PATIENT TYPE:  I  LOCATION:  2914                         FACILITY:  [facility]  PHYSICIAN:  Hillis Range, MD       DATE OF BIRTH:  [birth year]  DATE OF ADMISSION:  07/30/2010 DATE OF DISCHARGE:  08/01/2010                              DISCHARGE SUMMARY   PRIMARY CARDIOLOGIST:  Veverly Fells. Excell Seltzer, MD  PRIMARY CARE PHYSICIAN:  (Yet to obtain).  DISCHARGE DIAGNOSES: 1. Symptomatic bradycardia/syncope/Mobitz II atrioventricular block.     a.     Status post  successful pacemaker implantation with a St.      Jude Accent DR RF 2210 pacemaker, July 31, 2010. 2. Coronary artery disease.     a.     Cardiac catheterization, July 31, 2010:  Severe triple-      vessel obstructive atherosclerotic coronary artery disease.  All      grafts were patent including saphenous vein graft to posterior      descending artery, saphenous vein graft to the obtuse marginal,      and left internal mammary artery to left anterior descending.      Normal left ventricular function.  Advanced conduction system      disease with intermittent third-degree heart block.  Recommend      placement of permanent transvenous pacemaker.     b.     Status post acute myocardial infarction and ventricular      fibrillation arrest on October 03, 2006, complicated by cardiogenic      shock.  The patient underwent percutaneous transluminal coronary      angioplasty and thrombectomy of the left main and subsequently      coronary artery bypass graft x4 (left internal mammary artery -      left anterior descending, saphenous vein graft - diagonal 1,      saphenous vein graft - obtuse marginal, saphenous vein graft -      right coronary artery).     c.     2-D echocardiogram, August 01, 2009:  Interpretation of      images pending. 3. Diabetes mellitus (new diagnosis, HbA1c 13.3%).     a.     Discharged on metformin 500 p.o.  b.i.d., to start with 500      mg p.o. daily x1 week, the patient instructed as to the dire      importance of following up with a primary care provider in the      next week. 4. Hypertension.     a.     Systolic blood pressure above goal ranging from 130-150s,      addition of lisinopril 5 mg p.o. daily in setting of new diagnosis      of diabetes mellitus. 5. Hyperlipidemia. 6. History of palpitations and paroxysmal supraventricular     tachycardia.     a.     Status post radiofrequency catheter ablation, 2000. 7. Right bundle-branch block.  ALLERGIES:  BEE STINGS.  PROCEDURES: 1. EKG, July 30, 2010:  Normal sinus rhythm, first-degree  atrioventricular block, 63 bpm, right bundle-branch block, left     anterior fascicular block, no acute ST-T wave changes. 2. Chest x-ray, July 30, 2010:  Previous coronary artery bypass     graft, no acute findings. 3. Cardiac catheterization, July 31, 2010:  Please see discharge     diagnosis #1 subsection A. 4. Permanent pacemaker implantation, July 31, 2010:  Successful     implantation of a St. Jude Accent DR RF 2210 pacemaker without     immediate complications. 5. Chest x-ray, August 01, 2010:  Interval pacemaker placement     without complicating features. 6. 2-D echocardiogram, August 01, 2010:  Results pending. 7. EKG, August 01, 2010:  Ventricular pacing, 64 bpm, PR 214, QRS     152, QTc 455.  HISTORY OF PRESENT ILLNESS:  Mitchell Blair is a 60 year old Caucasian gentleman with the above-noted complex medical history who presented to [facility] ED after an episode of syncope and progressive presyncopal symptoms over the past 2 weeks.  Of note, the patient does have a history of presyncope and syncope, has previously declined an event monitor.  Over the years, occurring about once a month, he has had episodes of chest discomfort followed by lightheadedness and tunnel vision as well as a sensation of his heart stopping.   These symptoms were often associated with shortness of breath and nausea and the patient experienced a fairly significant presyncope when he tries to prevent by slumping to his knees.  At times, he will have frank syncope and reports unconsciousness for a few seconds and grogginess for 30-40 seconds post episode.  Over the past 2 weeks, he has had 4 occasions of syncope, the first of which occurred 2 weeks ago.  The patient was on treadmill and felt sudden onset of chest pain with dyspnea and then tunnel vision and then loss of consciousness.  From the time of chest pain to syncope, it was approximately 5 seconds.  He reports coming back to conscious nearly immediately after falling off the treadmill.  As per usual, he was groggy for less than a minute.  Since then, he has had multiple presyncopal spells, occurring approximately 5 times per day and being initiated by chest pain followed by dyspnea, nausea, and lightheadedness.  On the date of presentation, the patient was sitting on his computer when he had sudden onset of chest pain followed by dyspnea and nausea as well as diaphoresis and then within 5 seconds he had lost consciousness and struck the keyboard of his computer with his head.  His wife was nearby and attended to him.  He was only out for a few seconds and remained groggy for about 30-40 seconds.  Later in the morning, he had a second episode while sitting on bar stool.  At this time, he did not have frank syncope.  Because of the progressive nature of his symptoms, the patient presented to [facility] ED.  When he was evaluated by Cardiology, he was asymptomatic.  Vital signs were significant for mild hypertension, otherwise within normal limits and stable.  EKG showed no acute ST-T wave changes.  Lab work pending at the time of this admission.  HOSPITAL COURSE:  The patient was admitted and due to his high pretest probability and complaints of chest discomfort, he underwent  diagnostic cardiac catheterization on July 31, 2010 that did not show any significant culprit lesions, but did show conduction disease with intermittent third-degree heart block.  Secondary to this, he immediately received an Electrophysiology consult  and subsequent pacemaker implantation was completed at that same day.  In the interim, his lab work returned with significant hyperglycemia.  Subsequent HbA1c was severely elevated at 13.3%.  The patient was therefore given new diagnosis of diabetes mellitus.  The patient was kept overnight after pacemaker implantation and was seen by his electrophysiologist and deemed stable for discharge in the morning of August 01, 2010.  He did have a 2-D echocardiogram completed before leaving but the results are currently pending.  Of note, the patient ambulated around the Cardiac Intensive Care Unit several times without any symptoms.  Secondary to his new diagnosis of diabetes mellitus, he will receive a prescription for metformin 500 mg p.o. b.i.d. to be initiated at 500 mg p.o. daily x1 week.  He also received extensive education/instruction as the importance of finding a primary care physician a.s.a.p. to have his severe hyperglycemia addressed as soon as possible.  Also noteworthy was his mild hypertension which is above goal, especially given his new diagnosis of diabetes mellitus and in the interest of preserving renal function along with better blood pressure control.  He was given a prescription for lisinopril 5 mg p.o. daily.  The patient will follow up for a wound check and Pacer Clinic appointment at [facility] office on August 13, 2010 at 3:00 p.m.  He will then see his primary cardiologist on September 20, 2010 at 11:30 a.m. for a routine followup after this admission.  Especially, given his complaints of chest discomfort although it is very reassuring, there were no culprit lesions on cardiac catheterization.  2-D  echocardiogram will also be reviewed in the interim.  The patient will then follow up with electrophysiologist, Dr. Hillis Range, at a 58-month followup appointment to be scheduled (the patient will be contacted).  At the time of his discharge, the patient received his new medication list, prescriptions, followup instructions, post pacemaker implantation instructions, postcath instructions, and all questions and concerns were addressed prior to leaving the hospital.  DISCHARGE LABORATORY DATA:  WBC 6.9, HGB 17.3, HCT 49.9, and PLT count is 131.  WBC differential on admission was within normal limits except for monocytes at 13%.  Pro-time 15.2 and INR 1.18.  Blood glucose this admission ranged from 259-335.  Sodium 136, potassium 3.7, chloride 100, bicarb 24, BUN 11, and creatinine 1.07.  Liver function tests were within normal limits.  Hemoglobin A1c 13.3%, mean plasma glucose 335. Point-of-care markers were negative x3 except for minimally elevated CK of 311, peak on first set.  Total cholesterol 131, triglycerides 342, HDL 24, LDL 39, and total cholesterol/HDL ratio 5.5.  TSH 2.162.  FOLLOWUP PLANS AND APPOINTMENTS:  Please see hospital course.  DISCHARGE MEDICATIONS: 1. Acetaminophen 325 mg 1-2 tablets p.o. q.6 h. p.r.n. 2. Metformin 500 mg 1 tablet p.o. daily for 1 week and 1 tablet p.o.     b.i.d. thereafter. 3. Sublingual nitroglycerin 0.4 mg q.5 minutes up to 3 doses p.r.n.     for chest discomfort. 4. Enteric-coated aspirin 325 mg p.o. at bedtime. 5. Metoprolol succinate 50 mg 1 tablet p.o. at bedtime. 6. Simvastatin 40 mg p.o. at bedtime. 7. Lisinopril 5 mg p.o. daily.  DURATION OF DISCHARGE ENCOUNTER INCLUDING PHYSICIAN TIME:  35 minutes.     Jarrett Ables, PAC   ______________________________ Hillis Range, MD    MS/MEDQ  D:  08/01/2010  T:  08/02/2010  Job:  [id number]  cc:   Veverly Fells. Excell Seltzer, MD Hillis Range, MD  Electronically Signed by Molli Hazard  SPENCER PAC  on 08/08/2010 03:28:58 PM Electronically Signed by Hillis Range MD on 08/15/2010 10:20:00 PM

## 2010-08-15 NOTE — Op Note (Signed)
NAME:  Mitchell Blair, Mitchell Blair NO.:  [id number]  MEDICAL RECORD NO.:  [id number]          PATIENT TYPE:  INP  LOCATION:  2914                         FACILITY:  [facility]  PHYSICIAN:  Hillis Range, MD       DATE OF BIRTH:  23-Jul-1950  DATE OF PROCEDURE: DATE OF DISCHARGE:                              OPERATIVE REPORT   SURGEON:  Hillis Range, MD  PREPROCEDURE DIAGNOSIS:  Mobitz II second-degree atrioventricular block.  POSTPROCEDURE DIAGNOSIS:  Mobitz II second-degree atrioventricular block with intermittent complete heart block.  PROCEDURES: 1. Left upper extremity venography. 2. Pacemaker implantation.  INTRODUCTION:  Mitchell Blair is a pleasant 60 year old gentleman with a known history of coronary artery disease with a preserved ejection fraction who was admitted for further evaluation of syncope and found to have intermittent Mobitz II second-degree AV block.  The patient has a longstanding bifascicular block.  He states that over the past few weeks, he has had progressive symptoms of presyncope.  He has also had several syncopal episodes, which are always abrupt in onset and termination.  He was brought to [facility] and evaluated.  He underwent left heart catheterization by Dr. [place] which revealed chronic and stable coronary disease with no lesions requiring intervention.  His ejection fraction by left ventriculogram was 60% with normal left ventricular wall motion.  During the case, he was observed to have Mobitz II second-degree AV block with intermittent complete heart block.  He therefore presents for pacemaker implantation.  DESCRIPTION OF PROCEDURE:  Informed written consent was obtained and the patient was brought to the electrophysiology lab in the fasting state. He was adequately sedated with intravenous Versed as outlined in the nursing report.  The patient's left chest was prepped and draped in the usual sterile fashion by the EP lab  staff.  The skin overlying the left deltopectoral region was infiltrated with lidocaine for local analgesia. A 4-cm incision was made over the left deltopectoral region.  A venogram of the left upper extremity was performed which revealed a large left axillary vein with a large left cephalic vein which both emptied into a large left subclavian vein.  The left axillary vein was cannulated with fluoroscopic visualization.  Upon cannulation of the left axillary vein, the patient developed initially Mobitz II second-degree AV block with 2:1 AV conduction and subsequently undeveloped complete heart block with no ventricular escape.  He required temporary transcutaneous pacing.  A St. Jude Medical Tendril STS model [id number] (serial number [id number]) right ventricular lead was advanced with fluoroscopic visualization into the right ventricular apex position.  Ventricular pacing was then performed immediately.  There was no underlying R-wave.  The patient had an occasional PVC with R-waves measured at 18.5 mV.  His ventricular threshold was 1 volt at 0.4 milliseconds with an impedance of 696 ohms. A.  Once the patient was stabilized, transcutaneous pacing was discontinued.  A [employer]. Jude Medical Tendril STS model [phone] (serial number [id number]) right atrial lead was then advanced with fluoroscopic visualization into the right atrial appendage.  In this location, P- waves measured 3.5 mV with impedance of 581 ohms and  a threshold of 1 volt at 0.4 milliseconds.  Both leads were therefore secured to the pectoralis fascia using #2 silk suture over the suture sleeves.  The leads were then connected to a St. Jude Medical Accent DRRF model [id number] (serial number [id number]) pacemaker.  Significant care was taken with placing the leads into the device and once the leads were actively secured to the pacemaker, gentle traction was applied to both leads and it was confirmed that they were securely attached to  the pacemaker.  The pocket was then irrigated with copious gentamicin solution.  The pacemaker was then placed into the pocket.  The pocket was then closed in 2 layers with 2.0 Vicryl suture for the subcutaneous and subcuticular layers.  Steri-Strips and sterile dressing were then applied.  There were no early apparent complications.  CONCLUSIONS: 1. Successful implantation of a St. Jude Medical Accent DRRF pacemaker     for Mobitz II second-degree AV block with intermittent complete     heart block. 2. No early apparent complications.     Hillis Range, MD     JA/MEDQ  D:  07/31/2010  T:  08/01/2010  Job:  [id number]  cc:   Veverly Fells. Excell Seltzer, MD  Electronically Signed by Hillis Range MD on 08/15/2010 10:20:02 PM

## 2010-08-16 NOTE — Miscellaneous (Signed)
Summary: Device preload  Clinical Lists Changes  Observations: Added new observation of PPM INDICATN: Mobitz II (08/07/2010 12:14) Added new observation of MAGNET RTE: BOL 100 ERI 85 (08/07/2010 12:14) Added new observation of PPMLEADSTAT2: active (08/07/2010 12:14) Added new observation of PPMLEADSER2: [id number] (08/07/2010 12:14) Added new observation of PPMLEADMOD2: 2088TC (08/07/2010 12:14) Added new observation of PPMLEADLOC2: RV (08/07/2010 12:14) Added new observation of PPMLEADSTAT1: active (08/07/2010 12:14) Added new observation of PPMLEADSER1: [id number] (08/07/2010 12:14) Added new observation of PPMLEADMOD1: 2088TC (08/07/2010 12:14) Added new observation of PPMLEADLOC1: RA (08/07/2010 12:14) Added new observation of PPM IMP MD: Hillis Range, MD (08/07/2010 12:14) Added new observation of PPMLEADDOI2: 07/31/2010 (08/07/2010 12:14) Added new observation of PPMLEADDOI1: 07/31/2010 (08/07/2010 12:14) Added new observation of PPM DOI: 07/31/2010 (08/07/2010 12:14) Added new observation of PPM SERL#: [id number]  (08/07/2010 12:14) Added new observation of PPM MODL#: [id number]  (08/07/2010 23:76) Added new observation of PACEMAKERMFG: [employer]  (08/07/2010 12:14) Added new observation of PPM REFER MD: Tonny Bollman, MD  (08/07/2010 12:14) Added new observation of PACEMAKER MD: Hillis Range, MD  (08/07/2010 12:14)      PPM Specifications Following MD:  Hillis Range, MD     Referring MD:  Tonny Bollman, MD PPM Vendor:  [facility] Jude     PPM Model Number:  [id number]     PPM Serial Number:  [id number] PPM DOI:  07/31/2010     PPM Implanting MD:  Hillis Range, MD  Lead 1    Location: RA     DOI: 07/31/2010     Model #: [id number]     Serial #: [id number]     Status: active Lead 2    Location: RV     DOI: 07/31/2010     Model #: [id number]     Serial #: [id number]     Status: active  Magnet Response Rate:  BOL 100 ERI 85  Indications:  Mobitz II

## 2010-08-22 NOTE — Procedures (Signed)
Summary: wch. per mat pa.gd   Current Medications (verified): 1)  Metoprolol Succinate 50 Mg Xr24h-Tab (Metoprolol Succinate) .... Take 1 Tablet By Mouth Once  A Day 2)  Simvastatin 40 Mg Tabs (Simvastatin) .... Take 1 Tablet By Mouth At Bedtime 3)  Aspirin Ec 325 Mg Tbec (Aspirin) .... Take One Tablet By Mouth Daily 4)  Advil 200 Mg Tabs (Ibuprofen) .... As Needed 5)  Acetaminophen 325 Mg Tabs (Acetaminophen) .... As Needed 6)  Metformin Hcl 500 Mg Tabs (Metformin Hcl) .... One By Mouth Two Times A Day 7)  Nitrostat 0.4 Mg Subl (Nitroglycerin) .... As Needed  Allergies (verified): 1)  ! * Bee Stings  PPM Specifications Following MD:  Hillis Range, MD     Referring MD:  Tonny Bollman, MD [facility] Vendor:  [facility] Jude     PPM Model Number:  [id number]     PPM Serial Number:  [id number] PPM DOI:  07/31/2010     PPM Implanting MD:  Hillis Range, MD  Lead 1    Location: RA     DOI: 07/31/2010     Model #: [id number]     Serial #: [id number]     Status: active Lead 2    Location: RV     DOI: 07/31/2010     Model #: [id number]     Serial #: [id number]     Status: active  Magnet Response Rate:  BOL 100 ERI 85  Indications:  Mobitz II  Tech Comments:  See PaceArt MD Comments:  see scanned report

## 2010-08-28 NOTE — Cardiovascular Report (Signed)
Summary: Office Visit   Office Visit   Imported By: Roderic Ovens 08/24/2010 14:43:45  _____________________________________________________________________  External Attachment:    Type:   Image     Comment:   External Document

## 2010-09-08 ENCOUNTER — Encounter: Payer: Self-pay | Admitting: Cardiovascular Disease

## 2010-09-20 ENCOUNTER — Ambulatory Visit (INDEPENDENT_AMBULATORY_CARE_PROVIDER_SITE_OTHER): Payer: Self-pay | Admitting: Cardiovascular Disease

## 2010-09-20 ENCOUNTER — Encounter: Payer: Self-pay | Admitting: Cardiovascular Disease

## 2010-09-20 DIAGNOSIS — I471 Supraventricular tachycardia: Secondary | ICD-10-CM

## 2010-09-20 DIAGNOSIS — I2510 Atherosclerotic heart disease of native coronary artery without angina pectoris: Secondary | ICD-10-CM

## 2010-09-20 DIAGNOSIS — I25810 Atherosclerosis of coronary artery bypass graft(s) without angina pectoris: Secondary | ICD-10-CM

## 2010-09-20 DIAGNOSIS — I442 Atrioventricular block, complete: Secondary | ICD-10-CM

## 2010-09-20 DIAGNOSIS — E785 Hyperlipidemia, unspecified: Secondary | ICD-10-CM

## 2010-09-20 MED ORDER — LISINOPRIL 10 MG PO TABS: 10.0000 mg | ORAL_TABLET | Freq: Every day | ORAL | Status: DC

## 2010-09-20 MED ORDER — METOPROLOL SUCCINATE ER 50 MG PO TB24: 50.0000 mg | ORAL_TABLET | Freq: Every day | ORAL | Status: DC

## 2010-09-20 MED ORDER — METFORMIN HCL 500 MG PO TABS: 500.0000 mg | ORAL_TABLET | Freq: Two times a day (BID) | ORAL | Status: DC

## 2010-09-20 MED ORDER — SIMVASTATIN 20 MG PO TABS: 20.0000 mg | ORAL_TABLET | Freq: Every day | ORAL | Status: DC

## 2010-09-20 NOTE — Patient Instructions (Signed)
Your physician has recommended you make the following change in your medication: Decrease Simvastatin to 20mg  daily (cut 40mg  in half), Our office will refill Metformin  Your physician wants you to follow-up in: 6 MONTHS.  You will receive a reminder letter in the mail two months in advance. If you don't receive a letter, please call our office to schedule the follow-up appointment.  Your physician recommends that you return for a FASTING lipid, liver, BMP, and HgbA1C---nothing to eat or drink after midnight, have these labs drawn the same day as Dr Johney Frame appointment.

## 2010-09-23 ENCOUNTER — Encounter: Payer: Self-pay | Admitting: Cardiovascular Disease

## 2010-09-23 DIAGNOSIS — I442 Atrioventricular block, complete: Secondary | ICD-10-CM | POA: Insufficient documentation

## 2010-09-23 NOTE — Assessment & Plan Note (Signed)
Stable without angina. Recent cath demonstrated patent bypass grafts. Encouraged him regarding his exercise and weight loss program. Continue current medical program which includes ASA, ACE, and beta-blocker. He has been recently diagnosed with diabetes, and his metformin was refilled. Will check a CMET and HgB A1C for followup - he reports good glycemic control.

## 2010-09-23 NOTE — Assessment & Plan Note (Signed)
Pt is stable post-pacemaker placement. His syncopal symptoms have resolved.

## 2010-09-23 NOTE — Progress Notes (Signed)
HPI:  60 year-old male with CAD and conduction disease presents for followup evaluation. He recently underwent permanent pacemaker placement after presenting with syncope. He was found to have high-grade AV block with periods of asystole on a background of bifascicular block. He feels much better since his pacemaker was placed and he has no complaints today. Denies chest pain, dyspnea, or edema. He has started exercises and watching his diet - notes about 20 pound weight loss with these measures. He requests to decrease simvastatin to 20 mg daily.  Outpatient Encounter Prescriptions as of 09/20/2010  Medication Sig Dispense Refill  . aspirin 325 MG EC tablet Take 325 mg by mouth daily.        Marland Kitchen ibuprofen (ADVIL,MOTRIN) 200 MG tablet Take 200 mg by mouth every 6 (six) hours as needed.        Marland Kitchen lisinopril (PRINIVIL,ZESTRIL) 10 MG tablet Take 1 tablet (10 mg total) by mouth daily.  90 tablet  3  . metFORMIN (GLUCOPHAGE) 500 MG tablet Take 1 tablet (500 mg total) by mouth 2 (two) times daily with a meal.  180 tablet  3  . metoprolol (TOPROL-XL) 50 MG 24 hr tablet Take 1 tablet (50 mg total) by mouth daily.  90 tablet  3  . nitroGLYCERIN (NITROSTAT) 0.4 MG SL tablet Place 0.4 mg under the tongue every 5 (five) minutes as needed.        . simvastatin (ZOCOR) 20 MG tablet Take 1 tablet (20 mg total) by mouth at bedtime.  90 tablet  3  . DISCONTD: lisinopril (PRINIVIL,ZESTRIL) 10 MG tablet Take 10 mg by mouth daily.        Marland Kitchen DISCONTD: metFORMIN (GLUCOPHAGE) 500 MG tablet Take 500 mg by mouth 2 (two) times daily with a meal.        . DISCONTD: metoprolol (TOPROL-XL) 50 MG 24 hr tablet Take 50 mg by mouth daily.       Marland Kitchen DISCONTD: simvastatin (ZOCOR) 40 MG tablet Take 40 mg by mouth at bedtime.        Marland Kitchen DISCONTD: acetaminophen (TYLENOL) 325 MG tablet Take 650 mg by mouth every 6 (six) hours as needed.          No Known Allergies  Past Medical History  Diagnosis Date  . Past heart attack 09/2006    AMI  COMPLICATED BY CARDIAC ARREST 09/2006 EMERGENCY PCI/MULTIVESSEL CABG  . Hyperlipidemia   . Arrhythmia     PSVT    ROS: Negative except as per HPI  BP 112/72  Pulse 60  Resp 18  Wt 194 lb (87.998 kg)  PHYSICAL EXAM: Pt is alert and oriented, NAD HEENT: normal Neck: JVP - normal, carotids 2+= without bruits Lungs: CTA bilaterally CV: RRR without murmur or gallop Abd: soft, NT, Positive BS, no hepatomegaly Ext: no C/C/E, distal pulses intact and equal Skin: warm/dry no rash  EKG:  NSR with RBBB and LAFB, HR 61 bpm.  ASSESSMENT AND PLAN:

## 2010-09-23 NOTE — Assessment & Plan Note (Addendum)
Total chol 131 and LDL is less than 70. Suspect hypertriglyceridemia is related to poor glycemic control and would expect improvement with next labs. Continue statin Rx - will reduce simvastatin as he requested.

## 2010-09-25 ENCOUNTER — Encounter: Payer: Self-pay | Admitting: Cardiovascular Disease

## 2010-11-13 NOTE — Assessment & Plan Note (Signed)
[facility]                            CARDIOLOGY OFFICE NOTE   NAME:Mitchell Blair, Mitchell Blair                        MRN:          [id number]  DATE:01/13/2007                            DOB:          1951-05-14    SUBJECTIVE:  [name] returns for followup as an outpatient at  the [facility] Cardiology Office on January 13, 2007.  He is a 74 old man who  presented with an ST elevation infarction on October 03, 2006, and  ultimately was found to have a ruptured plaque in his left mainstem.  He  was treated with emergent stenting, followed by multivessel coronary  artery bypass graft surgery performed by Dr. Purcell Nails.  He  continued to do well from a symptomatic standpoint.  He has had some  difficulty coming to terms with his cardiac problems, but is not having  any particular cardiac complaints at this point.  He does have ongoing  chest pain related to his sternotomy.  He has no exertional chest pain  or anginal-type symptoms.  He denies dyspnea, orthopnea, PND, edema,  lightheadedness or syncope.   Mitchell Blair ran out of his metoprolol and Atorvastatin and did not  realize that he was supposed to continue these medications.  He  currently is only taking aspirin 325 mg daily.   ALLERGIES:  No known drug allergies.   PHYSICAL EXAMINATION:  GENERAL:  He is alert and oriented, in no acute  distress.  VITAL SIGNS:  Weight 189 pounds, blood pressure 110/80, heart rate 72,  respirations 16.  HEENT:  Normal with the exception of poor dentition.  NECK:  Normal carotid upstrokes without bruits.  Jugular venous pressure  is normal.  LUNGS:  Clear to auscultation bilaterally.  HEART:  The apex is discrete and non-displaced.  The heart is regular  rate and rhythm without murmurs or gallops.  ABDOMEN:  Soft, nontender.  No bruits.  EXTREMITIES:  No clubbing, cyanosis or edema.  Peripheral pulses 2+ and  equal throughout.   Electrocardiogram shows a normal  sinus rhythm with a right bundle branch  block and left axis deviation.   Lipids from January 05, 2007, showed a total cholesterol of 199, HDL 26, LDL  137, triglycerides 180.   ASSESSMENT/PLAN:  1. Mitchell Blair is currently stable from a cardiac standpoint.  His      cardiac problems are as follows:  Coronary artery disease with      recent myocardial infarction and bypass surgery.  His left      ventricular function is only mildly impaired with a left ventricle      ejection fraction of 45%-50%:  He should continue on anti-platelet      therapy with aspirin.  I have asked him to resume his extended      release metoprolol to a dose of 50 mg daily.  He also needs to      start on antihyperlipidemia therapy once again.  See below for      discussion.  I have encouraged him regarding an exercise program  and regular activity.  2. Dyslipidemia:  He was started on Simvastatin 40 mg daily and will      follow up with lipids and liver function tests in three months.  3. Poor dentition:  He would like to have his teeth pulled, so that he      can be fitted for dentures.  From a cardiac standpoint, he is an      acceptable risk to have this performed.   FOLLOWUP:  I would like to see Mitchell Blair back in three months after  his lipids and liver function tests are drawn.     Veverly Fells. Excell Seltzer, MD  Electronically Signed    MDC/MedQ  DD: 01/19/2007  DT: 01/20/2007  Job #: [id number]

## 2010-11-13 NOTE — Assessment & Plan Note (Signed)
[facility]                            CARDIOLOGY OFFICE NOTE   NAME:Mitchell Blair, Mitchell Blair                        MRN:          [id number]  DATE:04/15/2007                            DOB:          [birth year]    This is a patient of Dr. Tonny Bollman. This is a 60 year old married  white male patient who had acute ST segment elevation MI with  cardiogenic shock and pre-op ventricular fibrillation arrest in April  2008.  He had percutaneous intervention to the left main, intraaortic  balloon pump followed by CABG x4 on April 4.  He was seen in followup by  Dr. Excell Seltzer in July, and at that time had stopped his metoprolol and  simvastatin because his prescription ran out.  This has since been  restarted, and he is here for followup.  Overall, he is doing pretty  well.  He just started walking a mile a day this past week and is  feeling pretty good.  He does complain of palpitations, but he has had  these for 35 years.  He says occasionally he gets dizzy with it and he  sits down.  He spoke with Dr. Excell Seltzer about this, but as of now does not  want to wear an event recorder.  He has no exertional chest pain or  anginal symptoms.  He has some soreness from his sternotomy.  He has  tried to change his diet and quit smoking back in April.   CURRENT MEDICATIONS:  1. Aspirin 325 mg daily.  2. Simvastatin 40 mg daily.  3. Metoprolol 50 mg daily.   PHYSICAL EXAM:  This is a pleasant 60 year old white male in no acute  distress.  Blood pressure 138/84, pulse 53, weight 199.  NECK:  Without JVD, HJR, bruit, or thyroid enlargement.  LUNGS:  Clear anterior, posterior, and lateral.  HEART:  Regular rate and rhythm at 50 beats per minute.  Normal S1, S2.  No murmur, rub, bruit, thrill, or heave noted.  ABDOMEN:  Soft without organomegaly, mass, lesions, or abnormal  tenderness.  EXTREMITIES:  +1 edema on the right.  No edema on the left.  Positive  distal pulses.   ELECTROCARDIOGRAM:  Sinus bradycardia with right bundle branch block.  Left anterior fascicular block.  No acute change from prior tracings.  Lipid profile, LFTs are normal, cholesterol dropped to 128,  triglycerides have dropped from 180 to 166.  HDL unfortunately, went  from 26 down to 22, but LDL is down to 72.   IMPRESSION:  1. Coronary artery disease status post acute ST elevation myocardial      infarction with cardiogenic shock and preoperative ventricular      fibrillation arrest status post percutaneous intervention of the      left main.  2. Status post coronary artery bypass grafting x4 in April 2008 with a      left internal mammary artery to the left anterior descending,      saphenous vein graft to the first diagonal, saphenous vein graft to      the circumflex marginal, saphenous vein graft  to the right coronary      artery.  3. Hypertension.  4. Hyperlipidemia.  5. Poor dentition.  6. Ejection fraction 45-50%.  7. Palpitations.   PLAN:  At this time, I have offered the patient to wear an event  recorder to further define his palpitations and arrhythmias.  At this  time, he wants to hold off on this and try to pinpoint when these occur,  and he said he would call if he wants to go ahead and wear an event  recorder.  He will see Dr. Excell Seltzer back in 3 to 4 months.      Jacolyn Reedy, PA-C  Electronically Signed      Noralyn Pick. Eden Emms, MD, [facility]  Electronically Signed   ML/MedQ  DD: 04/15/2007  DT: 04/15/2007  Job #: [id number]

## 2010-11-13 NOTE — Assessment & Plan Note (Signed)
[facility]                            CARDIOLOGY OFFICE NOTE   NAME:Mitchell Blair, Mitchell Blair                        MRN:          [id number]  DATE:04/04/2008                            DOB:          21-Feb-1951    Mitchell Blair was seen in followup at the [facility] Cardiology office on  April 04, 2008.  He is a 60 year old gentleman with coronary artery  disease and prior myocardial infarction.  He presented in severe shock  and refractory ventricular fibrillation.  He underwent left main  stenting and emergency 4-vessel coronary bypass surgery.  He had a  remarkable recovery and continues to do well.  He denies any exertional  symptoms.  Specifically, he denies exertional chest pain or pressure.  He has no dyspnea.  He has some fleeting chest pains that are unchanged  over the past year.  The patient has longstanding episodes of  palpitations.  He describes the abrupt onset of palpitations that he is  able to abort with Valsalva maneuvers.  These have become more frequent  over the past few months.  He is currently having symptoms at least once  per week.  He feels poorly when his symptoms occur and develops acute  onset of dizziness.  He underwent catheter ablation approximately 10  years ago and was symptom free for 5 years following that, but developed  recurrent symptoms several years back.   MEDICATIONS:  1. Aspirin 325 mg daily.  2. Simvastatin 40 mg daily.  3. Metoprolol XL 50 mg daily.   ALLERGIES:  NKDA.   PHYSICAL EXAMINATION:  GENERAL:  The patient is alert and oriented.  He  is in no acute distress.  VITAL SIGNS:  Weight is 212 pounds, blood pressure 132/72, and heart  rate 60.  HEENT:  Normal.  NECK:  Normal carotid upstrokes.  No bruits.  JVP normal.  LUNGS:  Clear bilaterally.  HEART:  Regular rate and rhythm.  No murmurs or gallops.  ABDOMEN:  Soft, nontender, no organomegaly.  EXTREMITIES:  No clubbing, cyanosis, or edema.  Peripheral  pulses intact  and equal.   EKG:  Sinus rhythm with right bundle-branch block and left anterior  fascicular block.   ASSESSMENT:  1. Coronary artery disease status post coronary bypass surgery.  The      patient has preserved left ventricular function with an ejection      fraction of 64% by nuclear study from January this year.  He had no      ischemia at that time.  Continue aspirin, beta blocker, and statin      for secondary risk reduction.  2. Palpitations, suspect paroxysmal supraventricular tachycardia based      on history.  We will do the [employer] event recording and probably      we will refer to emergency department physician.  I wanted to refer      him at his last visit, but he was not interested at that time.      Since his symptoms are more pronounced at present, I think we      should  pursue this.  3. Dyslipidemia.  Lipids last checked 1 year ago showed a cholesterol      128, LDL 72, HDL 22,      and repeat lipid and LFTs.  4. For followup, I would like to see Mr. Steil back in 6 months.     Veverly Fells. Excell Seltzer, MD  Electronically Signed    MDC/MedQ  DD: 04/04/2008  DT: 04/05/2008  Job #: [id number]

## 2010-11-13 NOTE — Assessment & Plan Note (Signed)
[facility]                            CARDIOLOGY OFFICE NOTE   NAME:Mitchell Blair, Mitchell Blair                        MRN:          [id number]  DATE:07/14/2007                            DOB:          [birth year]    Mitchell Blair returned for follow-up at the [facility] Cardiology office on  July 14, 2007.  Mitchell Blair is a 60 year old gentleman with coronary  artery disease.  Mitchell Blair had a myocardial infarction approximately 9  months ago, complicated by shock and ventricular fibrillation.  He  underwent stent to the left main and ultimately underwent 4-vessel  bypass surgery.   Mitchell Blair has done well overall.  His main complaint at present  involves periodic palpitations.  Mitchell Blair has occasions where he  develops rapid heart rate.  When this occurs he lies down and his  symptoms resolve without further measures.  He occasionally can break  the rhythm with a Valsalva maneuver.  These are symptoms that has had  for many years.  He underwent an ablation approximately 10 years ago but  continued to have arrhythmias.  He does not develop chest pain or  pressure associated with these, he also denies lightheadedness or  dyspnea.  His symptoms occur relatively infrequently.  He estimates that  he may have 1 or 2 episodes in a period of 1 month and then may go  several months without any episodes.   He also complains of intermittent chest pressure.  He may have chest  pressure when going quickly from sitting to standing.  He has been able  to work a fairly physical job for several hours at a time without any  symptoms.  He has no other complaints today.   CURRENT MEDICATIONS:  1. Aspirin 325 mg daily.  2. Simvastatin 40 mg daily.  3. Metoprolol 50 mg daily.   ALLERGIES:  NKDA.   EXAM:  The patient is alert and oriented and he is in no acute distress.  Blood pressure is 120/80, heart rate 53, respiratory rate 16.  HEENT:  Normal.  NECK:  Normal  carotid upstrokes without bruits, jugular venous pressure  is normal.  LUNGS:  Clear to auscultation bilaterally.  HEART:  Regular rate and rhythm without murmurs or gallops.  ABDOMEN:  Soft, nontender, no organomegaly, no bruits.  EXTREMITIES:  No clubbing, cyanosis or edema.  Peripheral pulses are 2+  and equal throughout.   EKG shows sinus bradycardia with right bundle branch block and left  anterior fascicular block.  Unchanged from prior EKGs.   ASSESSMENT:  1. Coronary artery disease status post coronary artery bypass      grafting.  Mitchell Blair continues to do well with medical therapy.      However, I am concerned about his intermittent chest pressure.  I      have asked him to undergo an exercise Myoview scan to further risk      stratify him at this point.  I am encourage that he is able to do      hard work without symptoms and am hopeful that his chest  pressure      is unrelated to his underlying heart disease.  He should continue      on aspirin, beta blocker and statin therapy.  2. Symptomatic palpitations.  I offered Mitchell Blair an      electrophysiology evaluation or event recording, he declined.  He      will seek further evaluation if his symptoms become more frequent.  3. Dyslipidemia, marked by low HDL cholesterol.  Continue simvastatin      and repeat lipids at his follow-up office visit in 6 months.   For follow-up I will be in touch with Mitchell Blair after the results of  his exercise Myoview study.  Will see him back in clinic in 6 months.     Veverly Fells. Excell Seltzer, MD  Electronically Signed    MDC/MedQ  DD: 07/14/2007  DT: 07/14/2007  Job #: [id number]

## 2010-11-16 NOTE — Discharge Summary (Signed)
NAMEBRADYN, Mitchell Blair NO.:  [id number]   MEDICAL RECORD NO.:  [id number]          PATIENT TYPE:  INP   LOCATION:  2035                         FACILITY:  [facility]   PHYSICIAN:  Salvatore Decent. Cornelius Moras, M.D. DATE OF BIRTH:  04/04/1953   DATE OF ADMISSION:  10/03/2006  DATE OF DISCHARGE:  10/08/2006                               DISCHARGE SUMMARY   ADMISSION DIAGNOSIS:  Acute ST-elevation myocardial infarction.   IN-HOSPITAL DIAGNOSES:  1. Critical left main disease, 3-vessel coronary artery disease.  2. Acute ST-segment elevation myocardial infarction.  3. Preoperative ventricular fibrillation arrest.  4. Cardiogenic shock.  5. Percutaneous coronary intervention of the left main coronary      artery.  6. Intraaortic balloon pump placement.  7. Postoperative acute blood loss anemia.  8. Postoperative volume overload.   DISCHARGE DIAGNOSIS:  Coronary artery disease, status post coronary  artery bypass graft.   CONSULTS:  1. On October 03, 2006, Dr. Tonny Bollman was consulted for cardiology.  2. October 03, 2006, Dr. Tressie Stalker was consulted for cardiothoracic      surgery.   PROCEDURES:  1. On October 03, 2006, patient underwent a temporary transvenous      pacemaker placement, selective coronary angiography, PTCA in the      setting of the left main stem, thrombectomy of the left main stem,      intraaortic balloon pump insertion, prolonged CPR for multiple      defibrillations, Dr. Tonny Bollman.  2. October 03, 2006, patient underwent emergency median sternotomy with      coronary artery bypass grafting x4 (left internal mammary artery to      the left anterior descending coronary artery with saphenous vein      graft to the first diagonal branch, saphenous vein graft to the      circumflex marginal branch, saphenous vein graft to the distal      right coronary artery, endoscopic vein harvest from right side from      right middle of the leg) by Dr. Tressie Stalker.   HISTORY AND PHYSICAL:  Mr. Hollibaugh is a 60 year old white male who  presented to the emergency department with substernal chest pain.  No  history of coronary artery disease.  He has had no recent chest pain,  other than substernal chest pain presenting on October 03, 2006.  Please  see dictated history and physical for further details.   HOSPITAL COURSE:  Upon arrival to the emergency room, the patient was  complaining of 10/10 substernal chest pain.  This was associated with  diaphoresis and shortness of breath, as well as decreased respiratory  effort and decreased level of consciousness.  His skin had a grayish  tint to it.  He was emergently taken to the cardiac cath lab.  On exam,  the patient was found to have acute inferior ST elevation myocardial  infarction.  Patient was planed for intubation with a followup chest x-  ray.  While in the cath lab, the patient suffered a V-fib arrest.  He  was promptly resuscitated by Dr. Excell Seltzer.  Shortly after  arrival in the  cath lab, patient suffered a V-fib arrest and was promptly resuscitated  by Dr. Excell Seltzer and the staff in the cath lab.  Her underwent cardiac  catheterization and this demonstrated subtotal occlusion of the left  main coronary artery with severe 3-vessel coronary artery disease.  Balloon angioplasty and stenting of the left main coronary artery was  performed.  Intraaortic balloon pump was placed.  Temporary pacemaker  wires were placed.  Resuscitation was continued and the patient was  unstable with no measurable blood pressure for approximately 45 minutes.  At this juncture, the patient was __________ and began to have  measurable blood pressure with administration of an epi and continued  CPR.  Dr. Tressie Stalker was consulted.  The patient was brought directly  from the cath lab to the operating room for attempt at emergent salvage  revascularization.   The patient underwent emergent median sternotomy and CABG x4 without  any  complications.  Postoperatively, the patient has been progressing quite  well.  Patient was able come off the bypass.  Patient remained on the  ventilator until the evening of postop day number 1.  Patient was able  to be weaned from vent without any complications.  Patient's O2  saturations were 97% to 99% on October 06, 2006.  He has been doing his  incentive spirometry appropriately.  The patient currently is at 97% on  room air.  Patient was able to be weaned off dopamine and Neo  appropriately.  He has remained in normal sinus rhythm.  Blood pressure  has remained stable in the low 100s over 70s.   Patient did have some acute blood loss anemia postoperatively; however,  he did not require any blood transfusions and his hemoglobin and  hematocrit have remained stable.  Postop day number, his hemoglobin was  11.1, hematocrit 32.5.  Potassium remained stable at 4.1.  His chest x-  ray has remained stable.  Renal function remained stable with a BUN of  18, creatinine 1.0.  The patient did have postoperative volume overload.  He is being diuresed with Lasix and responding appropriately.   The patient is ambulating with cardiac rehab with a steady gait.  He had  an echocardiogram, October 06, 2006, which showed mild LV dysfunction with  a left ventricular ejection fraction of 40-50%.  Currently, he will stay  on a beta-blocker and will he sees Dr. Excell Seltzer his blood pressure, labs  an ACE inhibitor may be added.  Patient will also be started on a statin  to be discharged home on.  The patient does not have a history of  diabetes mellitus, he was maintained on a sliding scale of insulin  postoperatively.  CBGs have been well controlled at 102, 99, 71.  He  will not need any diabetes meds prior to discharge.   DISCHARGE PHYSICAL EXAM:  VITAL SIGNS:  Temperature 97.7.  Pulse 83.  Blood pressure 120/70.  O2 96% on room air.  Telemetry normal sinus  respirations. RESPIRATIONS:  Clear to  auscultation bilaterally.  CARDIAC:  Regular rate and rhythm.  ABDOMEN:  Benign.  EXTREMITIES:  No edema.  Incision site intact and healing well.   DISCHARGE DISPOSITION:  The patient will be discharged home in the next  1-2 days, provided he remains clinically stable.   MEDICATIONS:  1. Aspirin 325 mg p.o. daily.  2. Toprol-XL 50 mg p.o. daily.  3. Lipitor 10 mg p.o. daily.  4. Lasix 40 mg p.o. daily.  5. Potassium chloride 20 mEq p.o. daily x5 days.  6. Oxycodone 5-10 mg p.o. every 4 hours p.r.n.   INSTRUCTIONS:  Patient instructed to follow a low-fat, low-salt diet.  No driving or heavy lifting greater than 10 pounds for 3 weeks.  Patient  to ambulate 3-4 times daily.  Increase activity as tolerated.  He is to  continue his breathing exercises daily.  He may shower and clean his  incisions with mild soap and water.  He is to call the office if any  wound problems shall arise such as incision erythema, drainage, temp  greater than 101.5.   FOLLOWUP:  Patient will follow up with Dr. Cornelius Moras in 3 weeks, the office  will contact him with time of the appointment.  He is to call Dr.  Earmon Phoenix office an appointment in 2 weeks.      Constance Holster, PA      Salvatore Decent. Cornelius Moras, M.D.  Electronically Signed    JMW/MEDQ  D:  10/08/2006  T:  10/08/2006  Job:  [id number]   cc:   Veverly Fells. Excell Seltzer, MD

## 2010-11-16 NOTE — Cardiovascular Report (Signed)
NAMEGENEROSO, CROPPER NO.:  [id number]   MEDICAL RECORD NO.:  [id number]          PATIENT TYPE:  INP   LOCATION:  2399                         FACILITY:  [facility]   PHYSICIAN:  Veverly Fells. Excell Seltzer, MD  DATE OF BIRTH:  04/04/1953   DATE OF PROCEDURE:  10/03/2006  DATE OF DISCHARGE:                            CARDIAC CATHETERIZATION   PROCEDURES:  1. Temporary transvenous pacemaker placement.  2. Selective coronary angiography.  3. PTCA and stenting of the left mainstem.  4. Thrombectomy of the left mainstem.  5. Intra-aortic balloon pump insertion.  6. Prolonged CPR with multiple defibrillations.   INDICATIONS:  Mr. Frick is a 60 year old gentleman that presented with  an acute MI and cardiogenic shock.  He was immediately brought up from  the emergency department and required emergent intubation due to  respiratory failure and hemodynamic compromise.  His cardiac  catheterization was done under emergency circumstances.     Risks and indications of the procedure were not reviewed with the  patient as he was critically ill with hemodynamic and respiratory  compromise.  Upon arrival to the catheterization lab, the patient was  intubated and manually bagged throughout the procedure.  Both groins  were prepped and draped.  Due to the patient's marked bradycardia,  hemodynamic compromise, and a poor level of consciousness, a temporary  pacemaker was inserted through the right femoral vein.  A femoral venous  sheath was inserted initially using the modified Seldinger technique and  a balloon-tipped temporary pacemaker was placed at the apex of the right  ventricle.  The patient was paced throughout the procedure at a rate of  70-80.  Following temporary pacemaker insertion, arterial access was  gained using the modified Seldinger technique and a 7-French sheath was  placed in the right femoral artery.   Initially a JR-4 guide was inserted and the right coronary  artery was  not felt to be the culprit vessel.  Therefore, a CLS 3.5-mm guide  catheter was inserted into the left coronary artery.  CPR was performed  throughout as the patient had no blood pressure once arterial access was  gained.   INITIAL ANGIOGRAPHIC IMAGES:  Demonstrated an ulcerated plaque with  thrombus in the left main stem.  The patient was given 5000 units of  intravenous heparin.  Integrilin was started with a double bolus and a  drip.  CPR was continued as epinephrine, bicarbonate, calcium and other  resuscitative medications were given.   A cougar coronary guidewire wire was inserted into an intermediate  branch that had high-grade disease.  Aspiration thrombectomy was  attempted with a fetch catheter, but I was unable to advance the  catheter past the distal left main stem.  Following aspiration  thrombectomy, a 2.5-mm balloon was inserted and dilatations were  performed in the ramus intermedius branch back into the left mainstem.  Throughout this portion of the procedure, the patient continued to have  no blood pressure and prolonged CPR was performed.   His initial arterial blood gas demonstrated a pH of 6.9, that was  predominantly due to metabolic acidosis but was  a mixed metabolic and  respiratory acidosis.  More bicarbonate and epinephrine were given  following the balloon dilatation.  Eventually a 3.0 x 28-mm bare metal  stent was deployed from the ramus intermedius back to the proximal  portion of the left main stem.  This was deployed at 16 atmospheres.  Following stenting, the patient regained a blood pressure.  At that  point, an intra-aortic balloon pump was inserted through the left  femoral artery.  Arterial access was gained using the modified Seldinger  technique and an 8-French balloon pump sheath was inserted.  The balloon  pump was advanced up into the proximal descending aorta in a routine  fashion.   Dr. Cornelius Moras had been called emergently and was  present for much of the  case.  The patient appeared to have stabilized somewhat with an intra-  aortic balloon pump.  His augmented blood pressure was approximately 100-  mmHg.  We elected to send him for emergent bypass surgery with probable  VAD placement, as we thought this was his only reasonable chance to  survive this dramatic event.  Also of note, throughout the case the  patient and multiple episodes of ventricular fibrillation and required  multiple defibrillations.  The access sheaths were all left in place as  was the intra-aortic balloon pump and temporary transvenous pacemaker.   At the conclusion of the procedure, the patient had a 7-French arterial  sheath in the right femoral artery, a 7-French venous sheath in the  right femoral vein, an 8-French arterial sheath in the left femoral  artery, and a 5-French venous sheath in the left femoral vein.  All  sheaths were left in place and the patient was transported emergently to  the operating room, where he was under the care of Dr. Cornelius Moras.   FINDINGS:  The right coronary artery has severe diffuse disease  throughout.  It is a small vessel with no focal obstructive disease.  It  gives off a small PDA and a small posterolateral branch.   The left mainstem has a large ulcerated plaque in its proximal portion  with 90% stenosis and an apparent large thrombus burden.  The left  mainstem trifurcates into the LAD, left circumflex, and intermediate  branch.  The lesion in the left mainstem is complex and extends into the  distal left mainstem and proximal left coronary vessels.  The  intermediate branch has high-grade stenosis just beyond the  trifurcation.  This was an area of approximate 90% stenosis with likely  thrombus present.  The left circumflex also has ostial disease in the  range of 80%.  The LAD is a diffusely diseased vessel that extends down to the LV apex, gives off a diagonal branch and multiple perforators.  In the  distal LAD, there appears to be a high-grade focal stenosis.  There is a large first diagonal branch of the LAD that does not appear  to have significant obstructive disease.   ASSESSMENT:  1. Acute myocardial infarction involving ulcerated high-grade stenosis      in the left mainstem.  2. Cardiac arrest with prolonged CPR and multiple defibrillations.  3. Percutaneous transluminal coronary angioplasty stenting and      thrombectomy of the left main stem.  4. Cardiogenic shock.   As detailed above, the patient underwent aggressive resuscitative  measures as well as thrombectomy, angioplasty, and stenting of the left  mainstem.  Dr. Cornelius Moras was on hand for much of the procedure and is taking  Mr. Steil emergently to the operating room.  His help in this case is  greatly appreciated.  He likely will require multivessel bypass as well  as ventricular assist device therapy.  The patient obviously has an  exceedingly high mortality risk, and this was reviewed with his fiancee  in detail.      Veverly Fells. Excell Seltzer, MD  Electronically Signed     MDC/MEDQ  D:  10/03/2006  T:  10/03/2006  Job:  [id number]

## 2010-11-16 NOTE — Assessment & Plan Note (Signed)
[facility]                            CARDIOLOGY OFFICE NOTE   NAME:Eugenio, REMBERTO                        MRN:          [id number]  DATE:10/29/2006                            DOB:          04/04/1953    Keno Caraway returns for hospital followup at the [facility] Cardiology  office on October 29, 2006. He is a 60 year old man who presented to the  emergency department with severe substernal chest pain and had diffuse  ST elevation. He was brought emergently on April 4th for cardiac  catheterization and had a sub totally occluded left main stem. He had a  cardiac arrest and prolonged resuscitative efforts that included  thrombectomy and angioplasty as well as stenting of the left main stem,  intra-aortic balloon pump placement, temporary transvenous pacemaker  placement and multiple defibrillations as well as prolonged CPR. He  ultimately was resuscitated and went emergently for a coronary bypass  surgery which was performed by Dr.  Cornelius Moras. Postoperatively, he did  remarkably well and was discharged from the hospital on April 9th. His  coronary bypass included a LIMA to the LAD graft as well as saphenous  vein grafts to the first diagonal branch of the LAD, circumflex marginal  branch and distal right coronary artery.   Since his return home, Mr. Littler has had a sore throat and complaint  of food not tasting well to him. He has lost some weight. His activity  level has been relatively good. He walks approximately 3/4 of a mile  daily. He has not had any chest pain, dyspnea or other complaints.   CURRENT MEDICATIONS:  1. Aspirin 325 mg daily.  2. Metoprolol ER 50 mg daily.  3. Lipitor 10 mg daily.   PHYSICAL EXAMINATION:  He is alert and oriented in no acute distress.  Weight is 174 pounds. Blood pressure 107/78, heart rate 81. Respiratory  rate is 16.  HEENT: Is normal.  NECK: Normal carotid upstrokes without bruits. Jugular venous pressure  is  normal.  LUNGS:  Clear to auscultation bilaterally.  CARDIOVASCULAR: The heart is regular rate and rhythm without murmurs or  gallops.  CHEST: There is a well-healing midline sternotomy scar.  ABDOMEN: Soft and nontender. No organomegaly.  EXTREMITIES: No clubbing, cyanosis or edema. Peripheral pulses are 2+  and equal throughout.   EKG: Shows normal sinus rhythm with a right bundle branch block and left  anterior fascicular block.   ASSESSMENT:  Mr. Spicher is currently stable from a cardiovascular  standpoint. His current cardiac problems are as follows:  Acute myocardial infarction status post emergency coronary bypass  surgery in the setting of critical left main stem disease. Mr. Saccente  seems to have continued on the path of good recovery from his surgery.  He is tolerating his medications well at this time. He has had some  weight loss and has mild failure-to-thrive, but I think he will  ultimately recover well. I did not make any changes to his medications  today and would like to keep him on a low dose statin as well as aspirin  and beta-blocker.  His blood pressure is somewhat low and I am not  inclined to start him on an ACE inhibitor at today's visit. He had only  minor LV dysfunction postoperatively with an overall preserved LVEF in  the range of 50-55%.   I would like to see Mr. Asche back in 8 weeks for followup and have  lipids and LFTs drawn at that time. I asked him to consult with Dr.  Cornelius Moras prior to returning to work. We are setting up his followup  appointment with Dr.  Cornelius Moras. He will have a chest x-ray performed at  today's office visit.   If Mr. Schreier develops any problems in the interim, I would be happy to  see him back, but otherwise will plan on seeing him back in 8 weeks  time.     Veverly Fells. Excell Seltzer, MD  Electronically Signed    MDC/MedQ  DD: 10/29/2006  DT: 10/29/2006  Job #: [id number]   cc:   Salvatore Decent. Cornelius Moras, M.D.

## 2010-11-16 NOTE — Op Note (Signed)
NAMEDOMINGOS, RIGGI NO.:  [id number]   MEDICAL RECORD NO.:  [id number]          PATIENT TYPE:  INP   LOCATION:  2301                         FACILITY:  [facility]   PHYSICIAN:  Salvatore Decent. Cornelius Moras, M.D. DATE OF BIRTH:  04/04/1953   DATE OF PROCEDURE:  10/03/2006  DATE OF DISCHARGE:                               OPERATIVE REPORT   PREOPERATIVE DIAGNOSIS:  1. Critical left main disease, three vessel coronary artery disease.  2. Acute ST-segment elevation myocardial infarction.  3. Preoperative ventricular fibrillation arrest.  4. Cardiogenic shock.  5. Status post percutaneous coronary intervention of the left main      coronary artery.  6. Status post intra-aortic balloon pump placement.   POSTOPERATIVE DIAGNOSIS:  1. Critical left main disease, three vessel coronary artery disease.  2. Acute ST-segment elevation myocardial infarction.  3. Preoperative ventricular fibrillation arrest.  4. Cardiogenic shock.  5. Status post percutaneous coronary intervention of the left main      coronary artery.  6. Status post intra-aortic balloon pump placement.   PROCEDURE:  Emergency median sternotomy for coronary artery bypass  grafting x4 (left internal mammary artery to distal left anterior  descending coronary artery, saphenous vein graft to first diagonal  branch, saphenous vein graft to circumflex marginal branch, saphenous  vein graft to distal right coronary artery, endoscopic saphenous vein  harvest from right thigh and right lower leg).   SURGEON:  Salvatore Decent. Cornelius Moras, M.D.   ASSISTANT:  Salvatore Decent. Dorris Fetch, M.D.   SECOND ASSISTANT:  Rowe Clack, P.A.-C.   ANESTHESIA:  General.   BRIEF CLINICAL NOTE:  The patient is a 60 year old white male with  allegedly fairly unremarkable past medical history, per report.  The  patient apparently was in his usual state of health until the late  morning hours of April 4. While at work, the patient developed sudden  onset  of severe substernal chest pain associated with shortness of  breath.  EMS was summoned and he was brought to the emergency room where  he was promptly diagnosed with acute ST-segment elevation myocardial  infarction.  He was directly taken to the cardiac cath lab by Dr.  Tonny Bollman.  Shortly after arrival in the cath lab, the patient  suffered a V-fib arrest.  He was promptly resuscitated by Dr. Excell Seltzer and  the staff in the cath lab.  Cardiac catheterization demonstrates  subtotal occlusion of the left main coronary artery with severe three  vessel coronary artery disease.  Balloon angioplasty and stenting of the  left main coronary artery is performed.  The intra-aortic balloon pump  was placed.  Temporary pacemaker wires were placed.  Resuscitation was  continued and the patient was unstable with no measurable blood pressure  for approximately 45 minutes.  At this juncture, the patient's rhythm  resumed and he began to have measurable blood pressure after  administration of epinephrine and continued CPR.  The patient is brought  directly from the cath lab to the operating room for an attempt at  emergent salvage revascularization.   The patient's fiance is briefly counseled regarding  the emergent nature  of the circumstances and the significant potential for morbidity and  mortality.  All questions were answered briefly as the patient was  rapidly transported directly from the cath lab to the operating room for  emergency surgical intervention.   OPERATIVE FINDINGS:  1. Rapid recovery of the left ventricular function.  2. Good quality left internal mammary artery and saphenous vein      conduit for grafting.  3. Good quality target vessels for grafting.   OPERATIVE NOTE IN DETAIL:  The patient was brought directly from the  cath lab to the operating room on the above mentioned date and placed in  the supine position on the operating table.  General endotracheal  anesthesia is  induced uneventfully under the care and direction of Dr.  Judie Petit.  Intravenous antibiotics were administered.  Central  monitoring was established and a Swan-Ganz catheter is placed through  the right internal jugular approach.  A Foley catheter was placed.  The  patient's anterior chest, abdomen, both groins, and both lower  extremities were prepared and draped in a sterile manner.  The patient's  rhythm and blood pressure remained relatively stable during this part of  the procedure, and combined metabolic and respiratory acidosis was  aggressively corrected with adjustments in ventilation and  administration of intravenous sodium bicarbonate.   A median sternotomy incision was performed and the left internal mammary  artery was rapidly taken down from the chest wall and prepared for  bypass grafting.  Simultaneously, saphenous vein was obtained from the  patient's right thigh and the upper portion of the right lower leg using  endoscopic vein harvest technique.  The left internal mammary artery and  saphenous vein conduit were all felt to be good quality conduit.  The  patient is heparinized systemically.  The left internal mammary artery  is transected distally and was noted to have good flow.   The pericardium was opened.  The ascending aorta is normal in  appearance.  The ascending aorta and the right atrium were cannulated  for cardiopulmonary bypass.  Cardiopulmonary bypass is begun.  A  retrograde cardioplegic catheter is placed through the right atrium into  the coronary sinus.  The surface of the heart was inspected and distal  sites are selected for coronary bypass grafting.  A temperature probe is  placed in the left ventricular septum.  A cardioplegic catheter is  placed in the ascending aorta.   The patient is allowed to cool passively to 31 degrees systemic  temperature.  The aortic crossclamp was applied and cold blood cardioplegia is administered in an  antegrade fashion through the aortic  root.  Iced saline slush was applied for topical hypothermia.  Supplemental cardioplegia is administered retrograde through the  coronary sinus catheter.  Repeat doses of cardioplegia are administered  intermittently throughout the crossclamp portion of the operation  through the aortic root, down subsequently placed vein grafts, and  retrograde through the coronary sinus catheter to maintain left  ventricular septal temperature below 15 degrees centigrade.  The initial  cardioplegic arrest and myocardial cooling are felt to be excellent.   The following distal coronary anastomoses were performed:  1. The distal right coronary artery is grafted with a saphenous vein      graft in end-to-side fashion.  This vessel measures 1.5 mm in      diameter and is a fair to good quality target vessel for grafting.  2. The circumflex marginal branch is  grafted with a saphenous vein      graft in an end-to-side fashion.  This vessel is the medial sub-      branch of a large bifurcating circumflex marginal branch.  The      proximal portion of the vessel was severely diseased and calcified.      The site of distal bypass is good quality although the vessel was      intramyocardial.  It measures 2 mm in diameter at the site of      distal bypass.  3. The diagonal branch of the left anterior descending coronary artery      is grafted with a saphenous vein graft in an end-to-side fashion.      This vessel measures 1.3 mm in diameter and is a fair to good      quality target vessel for grafting.  4. The distal left anterior descending coronary artery is grafted with      the left internal mammary artery in an end-to-side fashion.  This      vessel measured 1.7 mm in diameter and is a good quality target      vessel for grafting.   All three proximal saphenous vein anastomoses are performed directly to  the ascending aorta prior to removal of the aortic crossclamp.   The left  ventricular septal temperature rises rapidly with reperfusion of the  left internal mammary artery.  One final dose of warm retrograde hot  shot cardioplegia is administered.  The aortic crossclamp was removed  after a total crossclamp time of 68 minutes.   The heart was defibrillated and a spontaneous rhythm subsequently  resumes.  Normal sinus rhythm resumes prior to completion of the  operation.  All proximal and distal anastomoses were inspected for  hemostasis and appropriate graft orientation.  Epicardial pacing wires  were fixed to the right ventricular outflow tract and to the right  atrial appendage.  Intra-aortic balloon counter pulsation was resumed  and the patient is rested on cardiopulmonary bypass for ten minutes  while rewarming is continued.  The patient is subsequently weaned from  cardiopulmonary bypass without difficulty.  The patient's rhythm at  separation from bypass is AV sequential paced, although pacing was subsequently discontinued and sinus rhythm resumes.  Total  cardiopulmonary bypass time for the operation is 105 minutes.  Follow-up  transesophageal echocardiogram performed by Dr. Randa Evens after separation  from bypass demonstrates remarkably normal appearing left ventricular  function.  The patient is weaned from bypass on dopamine at 5 mcg/kg per  minute and no other need for inotropic support.   The venous and arterial cannulae are removed uneventfully.  Protamine is  administered to reverse the anticoagulation.  The mediastinum and left  chest are irrigated with saline solution containing vancomycin.  Meticulous surgical hemostasis is ascertained.  The mediastinum and both  left and right pleural spaces were drained using four chest tubes exited  through separate stab incisions inferiorly.  The soft tissues anterior  to the aorta and pericardium were reapproximated loosely.  The sternum  was closed with double strength sternal wire.  The soft  tissues anterior  to the sternum are closed in multiple layers and the skin is closed with  a running subcuticular skin closure.   The patient tolerated the procedure remarkably well and was transported  to the surgical intensive care unit in stable condition.  There are no  intraoperative complications.  All sponge, instrument and needle counts  were verified  as correct at the completion of the operation.  The  patient was transfused 2 units packed red blood cells during  cardiopulmonary bypass.  The patient was transfused one pack of adult  platelets after separation from bypass.      Salvatore Decent. Cornelius Moras, M.D.  Electronically Signed     CHO/MEDQ  D:  10/03/2006  T:  10/04/2006  Job:  [id number]   cc:   Veverly Fells. Excell Seltzer, MD

## 2010-11-16 NOTE — H&P (Signed)
[name], [name] NO.:  [id number]   MEDICAL RECORD NO.:  [id number]          PATIENT TYPE:  INP   LOCATION:  2301                         FACILITY:  [facility]   PHYSICIAN:  Veverly Fells. Excell Seltzer, MD  DATE OF BIRTH:  04/04/1953   DATE OF ADMISSION:  10/03/2006  DATE OF DISCHARGE:                              HISTORY & PHYSICAL   PRIMARY CARDIOLOGIST:  He is new.   PRIMARY CARE PHYSICIAN:  Unknown.   CHIEF COMPLAINT:  Acute ST elevation MI.   HISTORY OF PRESENT ILLNESS:  Mitchell Blair is a 60 year old male with a  history of coronary artery disease.  He had no recent chest pain  according to his significant other.  He went to work today, and he had  onset of substernal chest pain.  He was transported urgently to the  emergency room.  Upon arrival in the emergency room, he was complaining  of 10/10 substernal chest pain.  This is associated with diaphoresis and  shortness of breath as well as decreased respiratory effort and  decreased level of consciousness.  His skin had a grayish tint to it.  He was taken emergently the to cardiac cath lab.   PAST MEDICAL HISTORY:  1. He is status post cardiac catheterization and reported intervention      but no further details are available.  2. He is also status post possible ablation.  We have confirmed that      he was [facility] in 2000 and 2001 and admitted through      the emergency room, but can get no other information at this time      as the patient is not able to sign a consent.   SURGICAL HISTORY:  Cardiac catheterization.   ALLERGIES:  NONE KNOWN.   CURRENT MEDICATIONS:  Unavailable.   SOCIAL HISTORY:  He lives in [place], [place] with a significant  other.  He is currently employed.  He has ongoing tobacco use and there  is no known history of EtOH or drug abuse.   FAMILY HISTORY:  Not available.   REVIEW OF SYSTEMS:  Significant for the diaphoresis, chest pain,  shortness of breath, as  well as some nausea.  He currently has no other  complaints.   PHYSICAL EXAMINATION:  VITAL SIGNS:  He is afebrile.  His initial blood  pressure is 130/80.  Pulse rate is 44, respiratory rate is 18, O2  saturation is 100% on a bag/valve mask.  GENERAL:  He is a well-developed, well-nourished, middle-aged male in  extreme distress.  HEENT:  His head is normocephalic and atraumatic with sclera clear.  Nares without discharge.  NECK:  There is no lymphadenopathy, thyromegaly, or JVD noted.  No  carotid bruits are appreciated.  CV:  Heart is regular rate and rhythm with an S1-S2.  No significant  murmur, rub, or gallop is noted.  He has decreased bilateral femoral and  radial pulses, but no femoral bruits are appreciated.  It is felt that  his blood pressure is decreasing and decreased pulses are secondary to a  central  and not peripheral cause.  LUNGS:  He has a few rales in the bases but they are essentially clear.  SKIN:  No rashes or lesions are noted, but he is gray and diaphoretic.  EXTREMITIES:  There is no cyanosis, clubbing, or edema noted.  ABDOMEN:  Soft and nontender with decreased bowel sounds, but they are  present.  MUSCULOSKELETAL:  There is no joint deformity or effusion.  NEUROLOGIC:  His level of consciousness is decreased but he responds to  loud verbal and painful stimulus.  He moves all four extremities.   Chest x-ray is pending.   EKG is sinus bradycardia, rate 43, with inferior ST elevation greater  than 4-mm.   LABORATORY VALUES:  Hemoglobin 15.5, hematocrit 44.6, WBCs 8.4,  platelets 232.  Other labs are pending at the time of dictation.   IMPRESSION:  1. Acute inferior ST elevation myocardial infarction, emergent      catheterization.  Further evaluation and treatment based on the      results.  2. Decreased level of consciousness and decreased respiratory effort.      Intubation is planned until he is more stable and will follow up      with chest  x-ray.   Mr. Caudillo is otherwise critically ill and is currently undergoing full  support.      Theodore Demark, PA-C      Veverly Fells. Excell Seltzer, MD  Electronically Signed    RB/MEDQ  D:  10/03/2006  T:  10/04/2006  Job:  [id number]

## 2010-11-21 ENCOUNTER — Other Ambulatory Visit (INDEPENDENT_AMBULATORY_CARE_PROVIDER_SITE_OTHER): Payer: Self-pay | Admitting: *Deleted

## 2010-11-21 ENCOUNTER — Ambulatory Visit (INDEPENDENT_AMBULATORY_CARE_PROVIDER_SITE_OTHER): Payer: Self-pay | Admitting: Internal Medicine

## 2010-11-21 ENCOUNTER — Encounter: Payer: Self-pay | Admitting: Internal Medicine

## 2010-11-21 DIAGNOSIS — I25810 Atherosclerosis of coronary artery bypass graft(s) without angina pectoris: Secondary | ICD-10-CM

## 2010-11-21 DIAGNOSIS — I471 Supraventricular tachycardia: Secondary | ICD-10-CM

## 2010-11-21 DIAGNOSIS — E785 Hyperlipidemia, unspecified: Secondary | ICD-10-CM

## 2010-11-21 DIAGNOSIS — I442 Atrioventricular block, complete: Secondary | ICD-10-CM

## 2010-11-21 LAB — HEMOGLOBIN A1C: Hgb A1c MFr Bld: 7.1 % — ABNORMAL HIGH (ref 4.6–6.5)

## 2010-11-21 LAB — HEPATIC FUNCTION PANEL
ALT: 29 U/L (ref 0–53)
AST: 24 U/L (ref 0–37)
Alkaline Phosphatase: 38 U/L — ABNORMAL LOW (ref 39–117)
Bilirubin, Direct: 0.2 mg/dL (ref 0.0–0.3)
Total Bilirubin: 0.8 mg/dL (ref 0.3–1.2)
Total Protein: 7.3 g/dL (ref 6.0–8.3)

## 2010-11-21 LAB — LIPID PANEL
Cholesterol: 113 mg/dL (ref 0–200)
VLDL: 26.6 mg/dL (ref 0.0–40.0)

## 2010-11-21 LAB — BASIC METABOLIC PANEL
BUN: 15 mg/dL (ref 6–23)
Calcium: 9.6 mg/dL (ref 8.4–10.5)
Creatinine, Ser: 1 mg/dL (ref 0.4–1.5)
GFR: 82.82 mL/min (ref >60.00)
Glucose, Bld: 119 mg/dL — ABNORMAL HIGH (ref 70–99)

## 2010-11-21 NOTE — Patient Instructions (Signed)
Your physician wants you to follow-up in: Jan 2013 You will receive a reminder letter in the mail two months in advance. If you don't receive a letter, please call our office to schedule the follow-up appointment.  

## 2010-11-22 ENCOUNTER — Encounter: Payer: Self-pay | Admitting: Internal Medicine

## 2010-11-22 NOTE — Assessment & Plan Note (Signed)
S/p PPM for Mobitz II AV block, now complete AV block Normal pacemaker function See Pace Art report

## 2010-11-22 NOTE — Assessment & Plan Note (Signed)
Stable No change required today  

## 2010-11-22 NOTE — Progress Notes (Signed)
The patient presents today for routine electrophysiology followup.  Since last being seen in our clinic, the patient reports doing very well.  Today, he denies symptoms of palpitations, chest pain, shortness of breath, orthopnea, PND, lower extremity edema, dizziness, presyncope, syncope, or neurologic sequela.  The patient feels that he is tolerating medications without difficulties and is otherwise without complaint today.   Past Medical History  Diagnosis Date  . Past heart attack 09/2006    AMI COMPLICATED BY CARDIAC ARREST 09/2006 EMERGENCY PCI/MULTIVESSEL CABG  . Hyperlipidemia   . Mobitz (type) II atrioventricular block     s/p PPM by JA   Past Surgical History  Procedure Date  . Coronary artery bypass graft 09/2006    FOUR VESSEL  . Pacemaker insertion 07/31/10    by Fawn Kirk (SJM) for Mobtiz II AV block    Current Outpatient Prescriptions  Medication Sig Dispense Refill  . aspirin 325 MG EC tablet Take 325 mg by mouth daily.        Marland Kitchen ibuprofen (ADVIL,MOTRIN) 200 MG tablet Take 200 mg by mouth every 6 (six) hours as needed.        Marland Kitchen lisinopril (PRINIVIL,ZESTRIL) 10 MG tablet Take 1 tablet (10 mg total) by mouth daily.  90 tablet  3  . metFORMIN (GLUCOPHAGE) 500 MG tablet Take 1 tablet (500 mg total) by mouth 2 (two) times daily with a meal.  180 tablet  3  . metoprolol (TOPROL-XL) 50 MG 24 hr tablet Take 1 tablet (50 mg total) by mouth daily.  90 tablet  3  . nitroGLYCERIN (NITROSTAT) 0.4 MG SL tablet Place 0.4 mg under the tongue every 5 (five) minutes as needed.        . NON FORMULARY C-INSULIN  -- 2 tablets daily       . simvastatin (ZOCOR) 20 MG tablet Take 1 tablet (20 mg total) by mouth at bedtime.  90 tablet  3    No Known Allergies  History   Social History  . Marital Status: Single    Spouse Name: N/A    Number of Children: N/A  . Years of Education: N/A   Occupational History  . Not on file.   Social History Main Topics  . Smoking status: Former Smoker    Types:  Cigarettes    Quit date: 09/30/2006  . Smokeless tobacco: Not on file  . Alcohol Use: No  . Drug Use: No  . Sexually Active: Not on file   Other Topics Concern  . Not on file   Social History Narrative  . No narrative on file    Physical Exam: Filed Vitals:   11/21/10 0842  BP: 113/80  Pulse: 59  Height: 6' (1.829 m)  Weight: 195 lb (88.451 kg)    GEN- The patient is well appearing, alert and oriented x 3 today.   Head- normocephalic, atraumatic Eyes-  Sclera clear, conjunctiva pink Ears- hearing intact Oropharynx- clear Neck- supple, no JVP Lymph- no cervical lymphadenopathy Lungs- Clear to ausculation bilaterally, normal work of breathing Chest- pacemaker pocket is well healed Heart- Regular rate and rhythm, no murmurs, rubs or gallops, PMI not laterally displaced GI- soft, NT, ND, + BS Extremities- no clubbing, cyanosis, or edema MS- no significant deformity or atrophy Skin- no rash or lesion Psych- euthymic mood, full affect Neuro- strength and sensation are intact  Pacemaker interrogation- reviewed in detail today,  See PACEART report  Assessment and Plan:

## 2011-01-09 ENCOUNTER — Encounter: Payer: Self-pay | Admitting: *Deleted

## 2011-03-22 ENCOUNTER — Encounter: Payer: Self-pay | Admitting: Cardiovascular Disease

## 2011-03-22 ENCOUNTER — Ambulatory Visit: Payer: Self-pay | Admitting: Cardiovascular Disease

## 2011-03-22 ENCOUNTER — Ambulatory Visit (INDEPENDENT_AMBULATORY_CARE_PROVIDER_SITE_OTHER): Payer: Self-pay | Admitting: Cardiovascular Disease

## 2011-03-22 VITALS — BP 145/85 | HR 61 | Ht 73.0 in | Wt 202.0 lb

## 2011-03-22 DIAGNOSIS — E785 Hyperlipidemia, unspecified: Secondary | ICD-10-CM

## 2011-03-22 DIAGNOSIS — E119 Type 2 diabetes mellitus without complications: Secondary | ICD-10-CM | POA: Insufficient documentation

## 2011-03-22 DIAGNOSIS — I442 Atrioventricular block, complete: Secondary | ICD-10-CM

## 2011-03-22 DIAGNOSIS — I25810 Atherosclerosis of coronary artery bypass graft(s) without angina pectoris: Secondary | ICD-10-CM

## 2011-03-22 DIAGNOSIS — E7800 Pure hypercholesterolemia, unspecified: Secondary | ICD-10-CM

## 2011-03-22 NOTE — Assessment & Plan Note (Signed)
The patient has type 2 diabetes and made a dramatic improvement in his hemoglobin A1c with weight loss. He does not have a primary care physician. He is treated with metformin 500 mg twice daily. He is going to monitor hemoglobin A1c with a home kit but he prefers to do rather than returning for lab work. He will send me the results. We discussed the need for continued efforts at diet and exercise.

## 2011-03-22 NOTE — Assessment & Plan Note (Signed)
The patient is stable without angina. He underwent cardiac catheterization demonstrating patency of his bypass grafts. Continue current medical management.

## 2011-03-22 NOTE — Progress Notes (Signed)
HPI:  Mr. Grinder returns for followup evaluation. He is a 60 year old gentleman with coronary artery disease status post coronary bypass surgery. He also has undergone permanent pacemaker placement for treatment of high-grade heart block with periods of asystole.  He is complaining of postural lightheadedness at times. He denies near syncope or frank syncope. He denies exertional chest pain or pressure. He has been less active over the summer months. He's gained almost 10 pounds since his last visit here. He had previously lost 20 pounds through diet and exercise. He monitors his blood glucose periodically with readings generally in the range of 100-150.  Outpatient Encounter Prescriptions as of 03/22/2011  Medication Sig Dispense Refill  . aspirin 325 MG EC tablet Take 325 mg by mouth daily.        Marland Kitchen ibuprofen (ADVIL,MOTRIN) 200 MG tablet Take 200 mg by mouth every 6 (six) hours as needed.        Marland Kitchen lisinopril (PRINIVIL,ZESTRIL) 10 MG tablet Take 1 tablet (10 mg total) by mouth daily.  90 tablet  3  . metFORMIN (GLUCOPHAGE) 500 MG tablet Take 1 tablet (500 mg total) by mouth 2 (two) times daily with a meal.  180 tablet  3  . metoprolol (TOPROL-XL) 50 MG 24 hr tablet Take 1 tablet (50 mg total) by mouth daily.  90 tablet  3  . nitroGLYCERIN (NITROSTAT) 0.4 MG SL tablet Place 0.4 mg under the tongue every 5 (five) minutes as needed.        . NON FORMULARY C-INSULIN  -- 2 tablets daily       . simvastatin (ZOCOR) 20 MG tablet Take 1 tablet (20 mg total) by mouth at bedtime.  90 tablet  3    No Known Allergies  Past Medical History  Diagnosis Date  . Past heart attack 09/2006    AMI COMPLICATED BY CARDIAC ARREST 09/2006 EMERGENCY PCI/MULTIVESSEL CABG  . Hyperlipidemia   . Mobitz (type) II atrioventricular block     s/p PPM by JA    ROS: Negative except as per HPI  BP 145/85  Pulse 61  Ht 6\' 1"  (1.854 m)  Wt 202 lb (91.627 kg)  BMI 26.65 kg/m2  PHYSICAL EXAM: Pt is alert and oriented,  NAD HEENT: normal Neck: JVP - normal, carotids 2+= without bruits Lungs: CTA bilaterally CV: RRR without murmur or gallop Abd: soft, NT, Positive BS, no hepatomegaly Ext: no C/C/E, distal pulses intact and equal Skin: warm/dry no rash  EKG:  AV sequential pacing 60 beats per minute.  ASSESSMENT AND PLAN:

## 2011-03-22 NOTE — Assessment & Plan Note (Signed)
Lipids have been at goal with an LDL less than 70 and these will be rechecked when he returns for followup in 6 months

## 2011-03-22 NOTE — Patient Instructions (Signed)
Please increase your fluid intake.   Your physician recommends that you return for lab work in February: BMP, LIVER, LIPID, HgbA1c  (414.01, 272.0)  Your physician wants you to follow-up in: March 2013. You will receive a reminder letter in the mail two months in advance. If you don't receive a letter, please call our office to schedule the follow-up appointment.  Your physician recommends that you continue on your current medications as directed. Please refer to the Current Medication list given to you today.

## 2011-03-22 NOTE — Assessment & Plan Note (Signed)
The patient is status post permanent pacemaker placement. We discussed the fact that he may not have an intact autonomic response to postural changes and stressed the importance of adequate fluid hydration.

## 2011-07-09 ENCOUNTER — Encounter: Payer: Self-pay | Admitting: *Deleted

## 2011-07-10 ENCOUNTER — Ambulatory Visit (INDEPENDENT_AMBULATORY_CARE_PROVIDER_SITE_OTHER): Payer: Self-pay | Admitting: Internal Medicine

## 2011-07-10 ENCOUNTER — Encounter: Payer: Self-pay | Admitting: Internal Medicine

## 2011-07-10 DIAGNOSIS — I442 Atrioventricular block, complete: Secondary | ICD-10-CM

## 2011-07-10 DIAGNOSIS — I471 Supraventricular tachycardia: Secondary | ICD-10-CM

## 2011-07-10 NOTE — Patient Instructions (Signed)
Your physician wants you to follow-up in:  12 months.  You will receive a reminder letter in the mail two months in advance. If you don't receive a letter, please call our office to schedule the follow-up appointment.   

## 2011-07-10 NOTE — Assessment & Plan Note (Signed)
Normal pacemaker function See Pace Art report No changes today  

## 2011-07-10 NOTE — Progress Notes (Signed)
The patient presents today for routine electrophysiology followup.  Since last being seen in our clinic, the patient reports doing reasonably well.  He remains active.  He has occasional dizziness which appears to occur when he stands from a bent over position.  Today, he denies symptoms of palpitations, chest pain, shortness of breath, orthopnea, PND, or lower extremity edema.  The patient feels that he is tolerating medications without difficulties and is otherwise without complaint today.   Past Medical History  Diagnosis Date  . Past heart attack 09/2006    AMI COMPLICATED BY CARDIAC ARREST 09/2006 EMERGENCY PCI/MULTIVESSEL CABG  . Hyperlipidemia   . Mobitz (type) II atrioventricular block     s/p PPM by JA  . SVT/ PSVT/ PAT   . Heart block AV complete   . Type 2 diabetes mellitus   . MILD COGNITIVE IMPAIRMENT SO STATED    Past Surgical History  Procedure Date  . Coronary artery bypass graft 09/2006    FOUR VESSEL  . Pacemaker insertion 07/31/10    by Fawn Kirk (SJM) for Mobtiz II AV block    Current Outpatient Prescriptions  Medication Sig Dispense Refill  . aspirin 325 MG EC tablet Take 325 mg by mouth daily.        Marland Kitchen ibuprofen (ADVIL,MOTRIN) 200 MG tablet Take 200 mg by mouth every 6 (six) hours as needed.        Marland Kitchen lisinopril (PRINIVIL,ZESTRIL) 10 MG tablet Take 1 tablet (10 mg total) by mouth daily.  90 tablet  3  . metFORMIN (GLUCOPHAGE) 500 MG tablet Take 1 tablet (500 mg total) by mouth 2 (two) times daily with a meal.  180 tablet  3  . metoprolol (TOPROL-XL) 50 MG 24 hr tablet Take 1 tablet (50 mg total) by mouth daily.  90 tablet  3  . nitroGLYCERIN (NITROSTAT) 0.4 MG SL tablet Place 0.4 mg under the tongue every 5 (five) minutes as needed.        . NON FORMULARY C-INSULIN  -- 2 tablets daily       . simvastatin (ZOCOR) 20 MG tablet Take 1 tablet (20 mg total) by mouth at bedtime.  90 tablet  3    No Known Allergies  History   Social History  . Marital Status: Single   Spouse Name: N/A    Number of Children: N/A  . Years of Education: N/A   Occupational History  . Not on file.   Social History Main Topics  . Smoking status: Former Smoker    Types: Cigarettes    Quit date: 09/30/2006  . Smokeless tobacco: Not on file  . Alcohol Use: No  . Drug Use: No  . Sexually Active: Not on file   Other Topics Concern  . Not on file   Social History Narrative  . No narrative on file    Physical Exam: Filed Vitals:   07/10/11 1525  BP: 129/82  Pulse: 86  Height: 6' (1.829 m)  Weight: 211 lb (95.709 kg)    GEN- The patient is well appearing, alert and oriented x 3 today.   Head- normocephalic, atraumatic Eyes-  Sclera clear, conjunctiva pink Ears- hearing intact Oropharynx- clear Neck- supple, no JVP Lymph- no cervical lymphadenopathy Lungs- Clear to ausculation bilaterally, normal work of breathing Chest- pacemaker pocket is well healed Heart- Regular rate and rhythm, no murmurs, rubs or gallops, PMI not laterally displaced GI- soft, NT, ND, + BS Extremities- no clubbing, cyanosis, or edema  Pacemaker interrogation- reviewed in  detail today,  See PACEART report  Assessment and Plan:

## 2011-10-04 ENCOUNTER — Other Ambulatory Visit (INDEPENDENT_AMBULATORY_CARE_PROVIDER_SITE_OTHER): Payer: Self-pay

## 2011-10-04 DIAGNOSIS — E7800 Pure hypercholesterolemia, unspecified: Secondary | ICD-10-CM

## 2011-10-04 DIAGNOSIS — I25810 Atherosclerosis of coronary artery bypass graft(s) without angina pectoris: Secondary | ICD-10-CM

## 2011-10-04 LAB — LIPID PANEL
Cholesterol: 162 mg/dL (ref 0–200)
HDL: 29.9 mg/dL — ABNORMAL LOW (ref >39.00)
Triglycerides: 311 mg/dL — ABNORMAL HIGH (ref 0.0–149.0)
VLDL: 62.2 mg/dL — ABNORMAL HIGH (ref 0.0–40.0)

## 2011-10-04 LAB — BASIC METABOLIC PANEL
BUN: 15 mg/dL (ref 6–23)
CO2: 24 mEq/L (ref 19–32)
Calcium: 10.2 mg/dL (ref 8.4–10.5)
Creatinine, Ser: 1.1 mg/dL (ref 0.4–1.5)
GFR: 73.04 mL/min (ref >60.00)
Glucose, Bld: 236 mg/dL — ABNORMAL HIGH (ref 70–99)
Sodium: 136 mEq/L (ref 135–145)

## 2011-10-04 LAB — HEPATIC FUNCTION PANEL
Albumin: 4.7 g/dL (ref 3.5–5.2)
Total Protein: 8 g/dL (ref 6.0–8.3)

## 2011-10-07 ENCOUNTER — Other Ambulatory Visit: Payer: Self-pay

## 2011-10-07 DIAGNOSIS — I471 Supraventricular tachycardia: Secondary | ICD-10-CM

## 2011-10-07 DIAGNOSIS — E785 Hyperlipidemia, unspecified: Secondary | ICD-10-CM

## 2011-10-07 DIAGNOSIS — I25810 Atherosclerosis of coronary artery bypass graft(s) without angina pectoris: Secondary | ICD-10-CM

## 2011-10-11 ENCOUNTER — Encounter: Payer: Self-pay | Admitting: Cardiovascular Disease

## 2011-10-11 ENCOUNTER — Ambulatory Visit (INDEPENDENT_AMBULATORY_CARE_PROVIDER_SITE_OTHER): Payer: Self-pay | Admitting: Cardiovascular Disease

## 2011-10-11 VITALS — BP 128/84 | HR 69 | Ht 72.0 in | Wt 205.0 lb

## 2011-10-11 DIAGNOSIS — I10 Essential (primary) hypertension: Secondary | ICD-10-CM | POA: Insufficient documentation

## 2011-10-11 DIAGNOSIS — E785 Hyperlipidemia, unspecified: Secondary | ICD-10-CM

## 2011-10-11 DIAGNOSIS — I471 Supraventricular tachycardia: Secondary | ICD-10-CM

## 2011-10-11 DIAGNOSIS — I25810 Atherosclerosis of coronary artery bypass graft(s) without angina pectoris: Secondary | ICD-10-CM

## 2011-10-11 DIAGNOSIS — I2510 Atherosclerotic heart disease of native coronary artery without angina pectoris: Secondary | ICD-10-CM

## 2011-10-11 DIAGNOSIS — I442 Atrioventricular block, complete: Secondary | ICD-10-CM

## 2011-10-11 DIAGNOSIS — E119 Type 2 diabetes mellitus without complications: Secondary | ICD-10-CM

## 2011-10-11 MED ORDER — METOPROLOL SUCCINATE ER 50 MG PO TB24: 50.0000 mg | ORAL_TABLET | Freq: Every day | ORAL | Status: DC

## 2011-10-11 MED ORDER — METFORMIN HCL 1000 MG PO TABS: 1000.0000 mg | ORAL_TABLET | Freq: Two times a day (BID) | ORAL | Status: DC

## 2011-10-11 MED ORDER — SIMVASTATIN 20 MG PO TABS: 20.0000 mg | ORAL_TABLET | Freq: Every day | ORAL | Status: DC

## 2011-10-11 NOTE — Progress Notes (Signed)
   HPI:  Mr. Buntrock is a 61 year old gentleman presenting for followup evaluation. The patient has CAD status post CABG, and permanent pacemaker for high-grade AV block. He is pacer dependent. He has developed type 2 diabetes.  He has not been feeling well of late. He's been having problems with a pain and scratchy feeling in the back of his throat. He has been having daily episodes of vomiting. This occurs soon after eating food and the frequency is about once daily. He has excessive salivation. After he throws up he feels better. He denies chest pain or pressure. He denies dyspnea, orthopnea, PND, or edema. He also reports episodes of lightheadedness and weakness, especially after bending forward. He has nearly lost vision but has not completely lost consciousness. He's been compliant with his medications.  Outpatient Encounter Prescriptions as of 10/11/2011  Medication Sig Dispense Refill  . aspirin 325 MG EC tablet Take 325 mg by mouth daily.        Marland Kitchen CINNAMON PO Take by mouth. Taking 4 daily      . ibuprofen (ADVIL,MOTRIN) 200 MG tablet Take 200 mg by mouth every 6 (six) hours as needed.        . metFORMIN (GLUCOPHAGE) 1000 MG tablet Take 1 tablet (1,000 mg total) by mouth 2 (two) times daily with a meal.  180 tablet  3  . metoprolol succinate (TOPROL-XL) 50 MG 24 hr tablet Take 1 tablet (50 mg total) by mouth daily.  90 tablet  3  . nitroGLYCERIN (NITROSTAT) 0.4 MG SL tablet Place 0.4 mg under the tongue every 5 (five) minutes as needed.        . NON FORMULARY C-INSULIN  -- 2 tablets daily       . simvastatin (ZOCOR) 20 MG tablet Take 1 tablet (20 mg total) by mouth at bedtime.  90 tablet  3  . DISCONTD: lisinopril (PRINIVIL,ZESTRIL) 10 MG tablet Take 1 tablet (10 mg total) by mouth daily.  90 tablet  3  . DISCONTD: metFORMIN (GLUCOPHAGE) 500 MG tablet Take 1.5 tablets (750 mg total) by mouth 2 (two) times daily with a meal.  180 tablet  3  . DISCONTD: metoprolol (TOPROL-XL) 50 MG 24 hr tablet  Take 1 tablet (50 mg total) by mouth daily.  90 tablet  3  . DISCONTD: simvastatin (ZOCOR) 20 MG tablet Take 1 tablet (20 mg total) by mouth at bedtime.  90 tablet  3    No Known Allergies  Past Medical History  Diagnosis Date  . Past heart attack 09/2006    AMI COMPLICATED BY CARDIAC ARREST 09/2006 EMERGENCY PCI/MULTIVESSEL CABG  . Hyperlipidemia   . Mobitz (type) II atrioventricular block     s/p PPM by JA  . SVT/ PSVT/ PAT   . Heart block AV complete   . Type 2 diabetes mellitus   . MILD COGNITIVE IMPAIRMENT SO STATED     ROS: Negative except as per HPI  BP 128/84  Pulse 69  Ht 6' (1.829 m)  Wt 92.987 kg (205 lb)  BMI 27.80 kg/m2  PHYSICAL EXAM: Pt is alert and oriented, NAD HEENT: normal Neck: JVP - normal, carotids 2+= without bruits Lungs: CTA bilaterally CV: RRR without murmur or gallop Abd: soft, NT, Positive BS, no hepatomegaly Ext: no C/C/E, distal pulses intact and equal Skin: warm/dry no rash  EKG:  Electronic ventricular pacemaker 69 beats per minute  ASSESSMENT AND PLAN:

## 2011-10-11 NOTE — Patient Instructions (Signed)
Your physician has recommended you make the following change in your medication: INCREASE Metformin to 1000mg  take one by mouth twice a day, STOP Lisinopril--please call the office in 1 MONTH and let us know how you are feeling  Your physician recommends that you return for lab work in 3 MONTHS: BMP and HgbA1c  Your physician wants you to follow-up in: 6 MONTHS.  You will receive a reminder letter in the mail two months in advance. If you don't receive a letter, please call our office to schedule the follow-up appointment.

## 2011-10-11 NOTE — Assessment & Plan Note (Signed)
Stable without angina. Continue current medical program with aspirin for antiplatelet therapy and low dose simvastatin for treatment of his hyperlipidemia.

## 2011-10-11 NOTE — Assessment & Plan Note (Signed)
The patient has become pacemaker dependent. He is followed by Dr. Johney Frame.

## 2011-10-11 NOTE — Assessment & Plan Note (Signed)
I wonder if his symptoms localized in the throat area are related to lisinopril. I did ask him to hold this for the next month to see if he has improvement. If he does not, I have suggested that he pursue a gastroenterology or ENT evaluation. He will call me next month to let me know how he's doing.

## 2011-10-11 NOTE — Assessment & Plan Note (Signed)
Suboptimal control with recent increase in his hemoglobin A1c. I reviewed his weight he has gone from 194 pounds up to 211 pounds and now back down to 205 pounds. We discussed the importance of a low glycemic diet and continue to focus on weight loss. I will increase his metformin to 1000 mg twice daily. He'll have followup lab work in 3 months.

## 2012-01-08 ENCOUNTER — Ambulatory Visit (INDEPENDENT_AMBULATORY_CARE_PROVIDER_SITE_OTHER): Payer: Self-pay | Admitting: *Deleted

## 2012-01-08 ENCOUNTER — Other Ambulatory Visit: Payer: Self-pay

## 2012-01-08 ENCOUNTER — Encounter: Payer: Self-pay | Admitting: Internal Medicine

## 2012-01-08 DIAGNOSIS — I442 Atrioventricular block, complete: Secondary | ICD-10-CM

## 2012-01-08 LAB — PACEMAKER DEVICE OBSERVATION
AL AMPLITUDE: 3.4 mv
AL IMPEDENCE PM: 487.5 Ohm
AL THRESHOLD: 0.625 V
BATTERY VOLTAGE: 2.9478 V
RV LEAD IMPEDENCE PM: 537.5 Ohm
RV LEAD THRESHOLD: 0.625 V

## 2012-01-08 IMAGING — CR DG CHEST 2V
2 series · 2 of 2 positions shown · non-contrast
Comparison: 10/07/2006

CLINICAL DATA: Chest pain, previous coronary bypass

CHEST - 2 VIEW

[w chest pa]
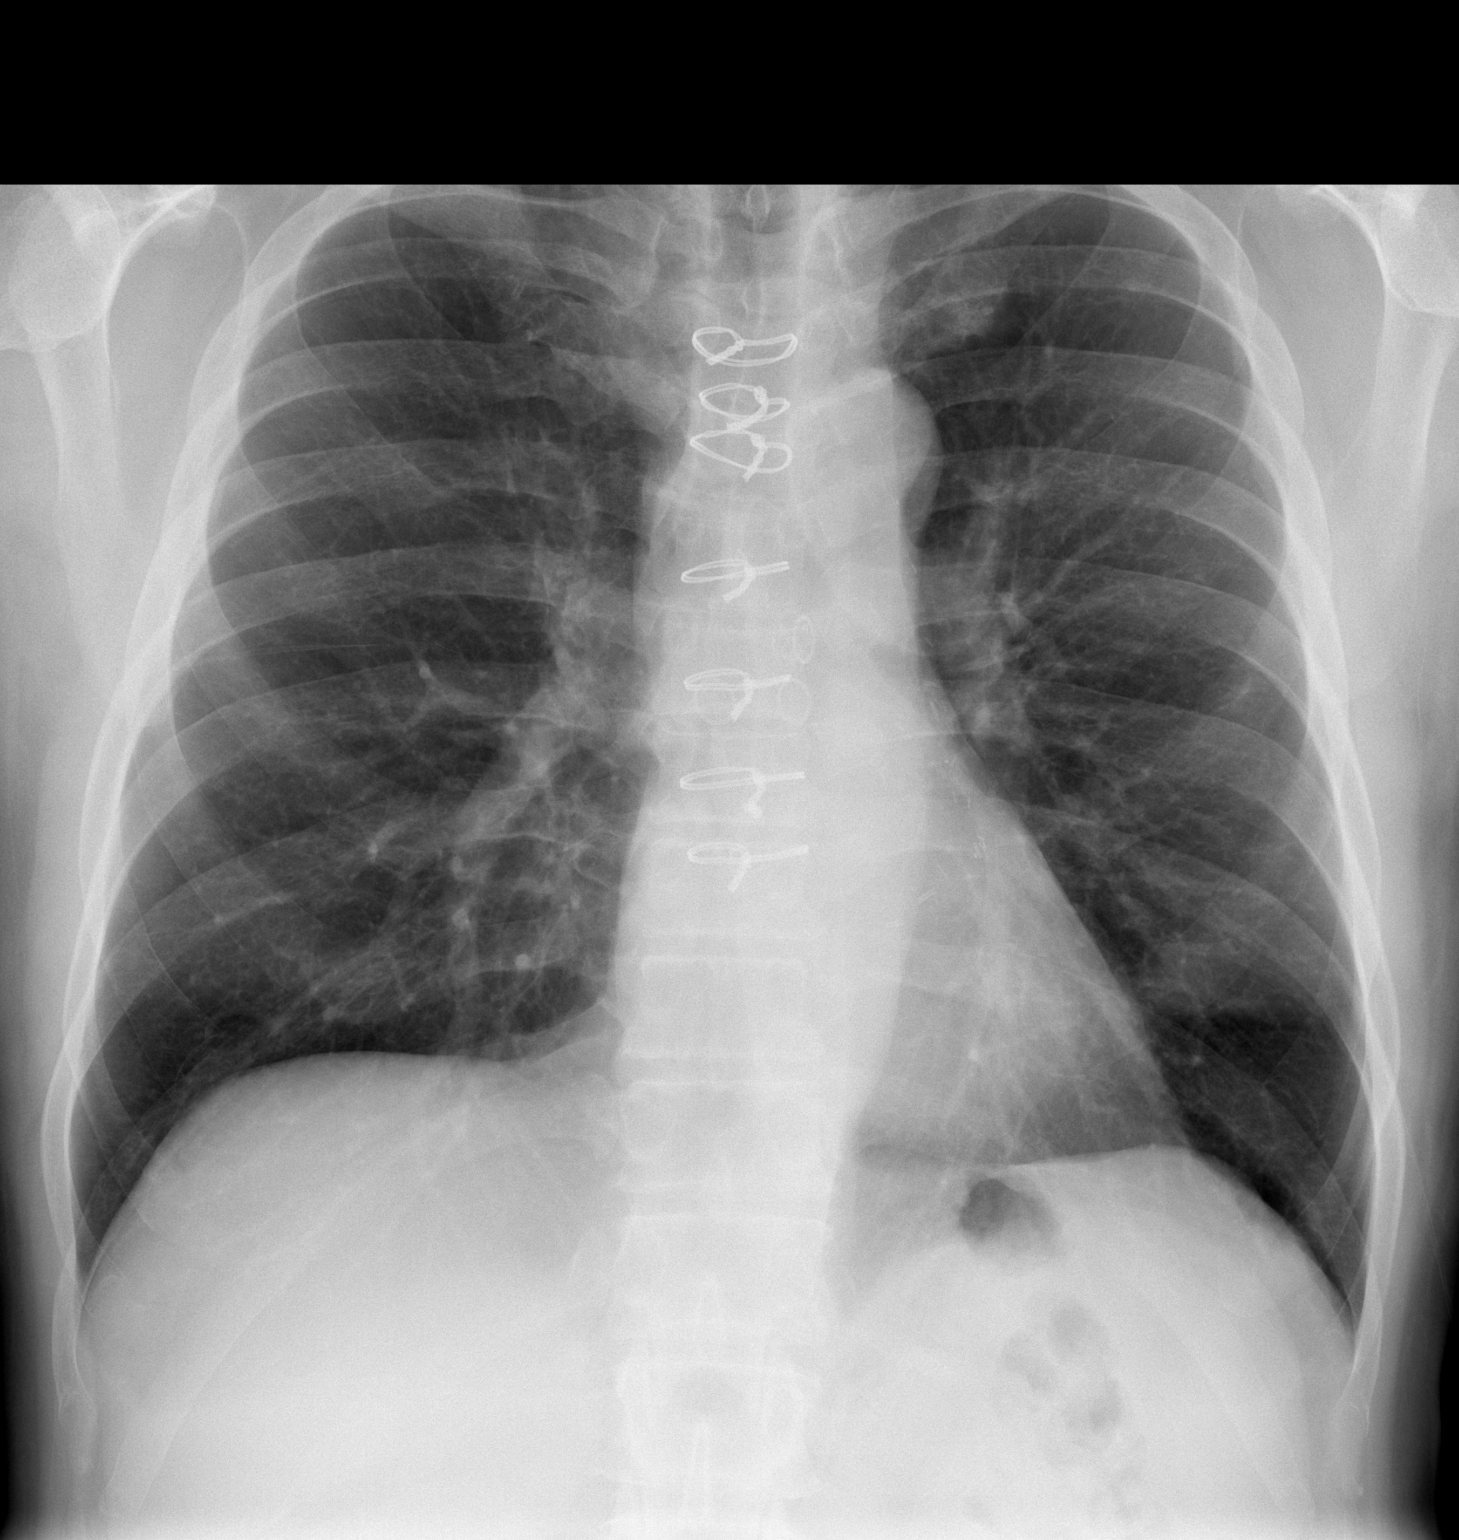

[w chest lat]
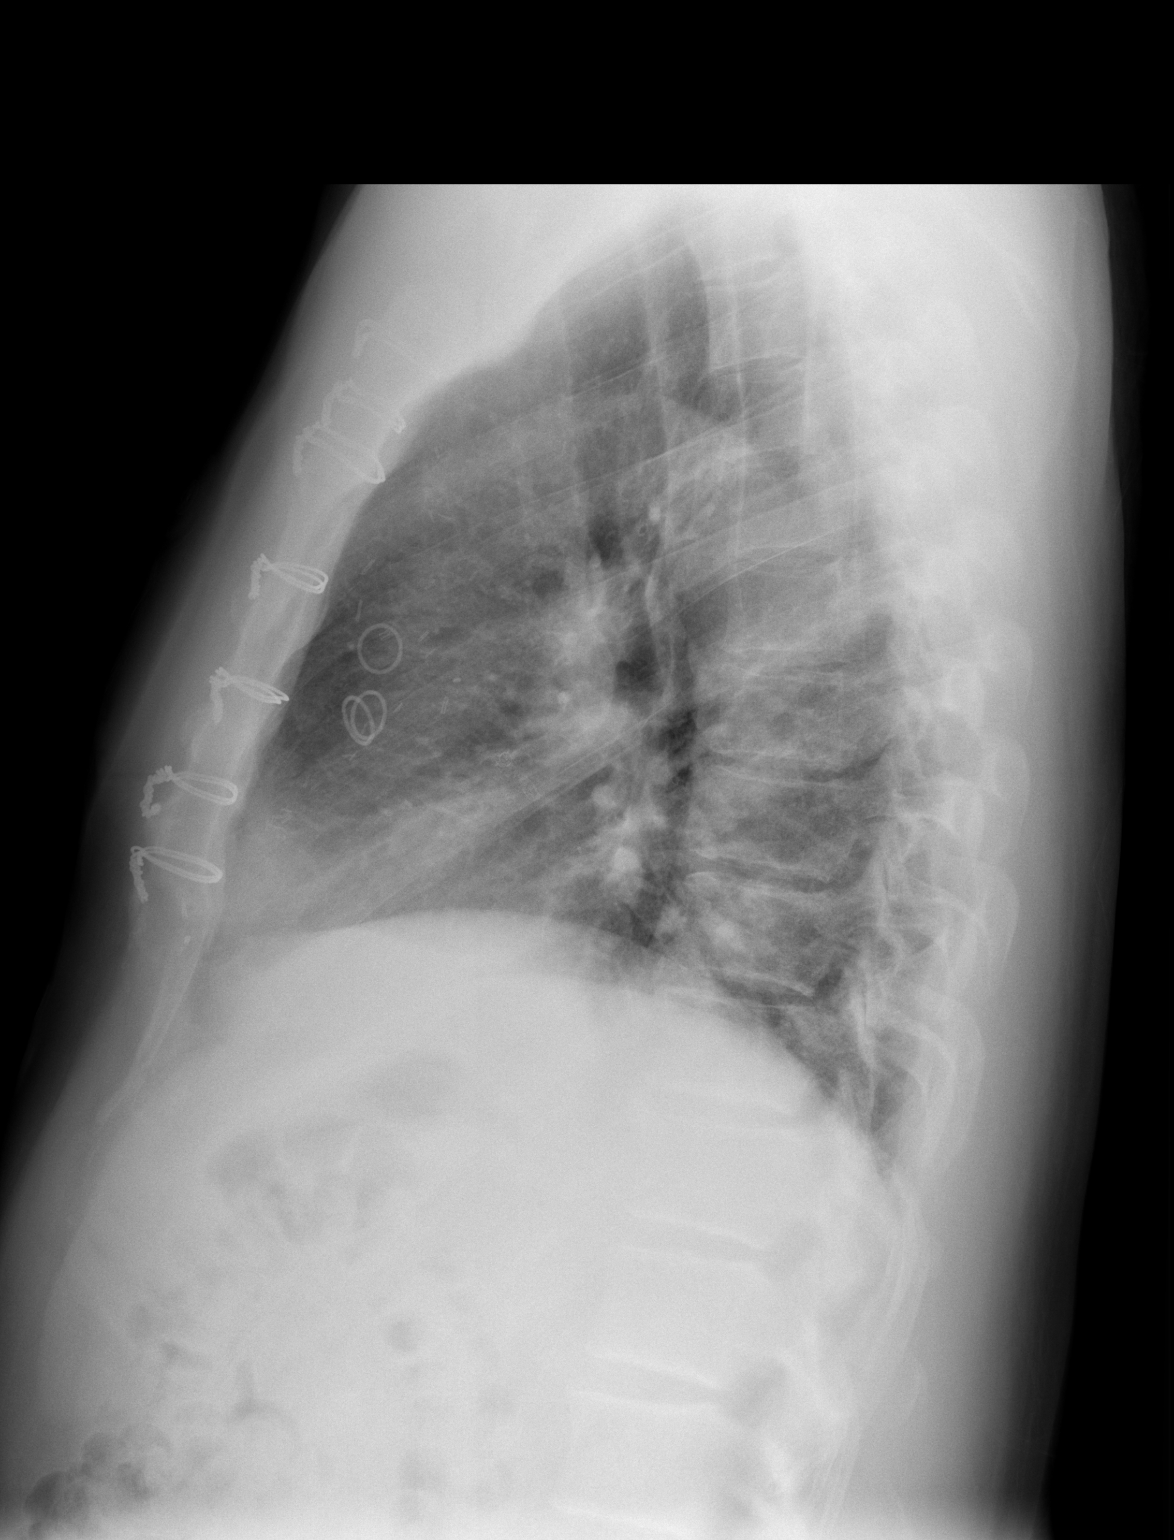

[2 of 2 positions shown; findings below may reference images not displayed]

FINDINGS: Normal heart size and vascularity.  Previous coronary
bypass changes noted.  Negative for pneumonia, edema, effusion or
pneumothorax.  Midline trachea.  No acute osseous finding
IMPRESSION: Previous coronary bypass changes.  No acute chest finding.

## 2012-01-08 NOTE — Progress Notes (Signed)
PPM check 

## 2012-01-10 ENCOUNTER — Ambulatory Visit: Payer: Self-pay | Admitting: Cardiovascular Disease

## 2012-01-10 ENCOUNTER — Other Ambulatory Visit (INDEPENDENT_AMBULATORY_CARE_PROVIDER_SITE_OTHER): Payer: Self-pay

## 2012-01-10 ENCOUNTER — Other Ambulatory Visit: Payer: Self-pay

## 2012-01-10 DIAGNOSIS — I471 Supraventricular tachycardia: Secondary | ICD-10-CM

## 2012-01-10 DIAGNOSIS — E785 Hyperlipidemia, unspecified: Secondary | ICD-10-CM

## 2012-01-10 DIAGNOSIS — I25810 Atherosclerosis of coronary artery bypass graft(s) without angina pectoris: Secondary | ICD-10-CM

## 2012-01-10 DIAGNOSIS — I2510 Atherosclerotic heart disease of native coronary artery without angina pectoris: Secondary | ICD-10-CM

## 2012-01-10 LAB — BASIC METABOLIC PANEL
BUN: 15 mg/dL (ref 6–23)
CO2: 26 mEq/L (ref 19–32)
Chloride: 100 mEq/L (ref 96–112)
Creatinine, Ser: 1 mg/dL (ref 0.4–1.5)
Glucose, Bld: 132 mg/dL — ABNORMAL HIGH (ref 70–99)

## 2012-01-10 IMAGING — CR DG CHEST 2V
2 series · 2 of 2 positions shown · non-contrast
Comparison: 07/30/2010

CLINICAL DATA: Post pacemaker.

CHEST - 2 VIEW

[w chest pa]
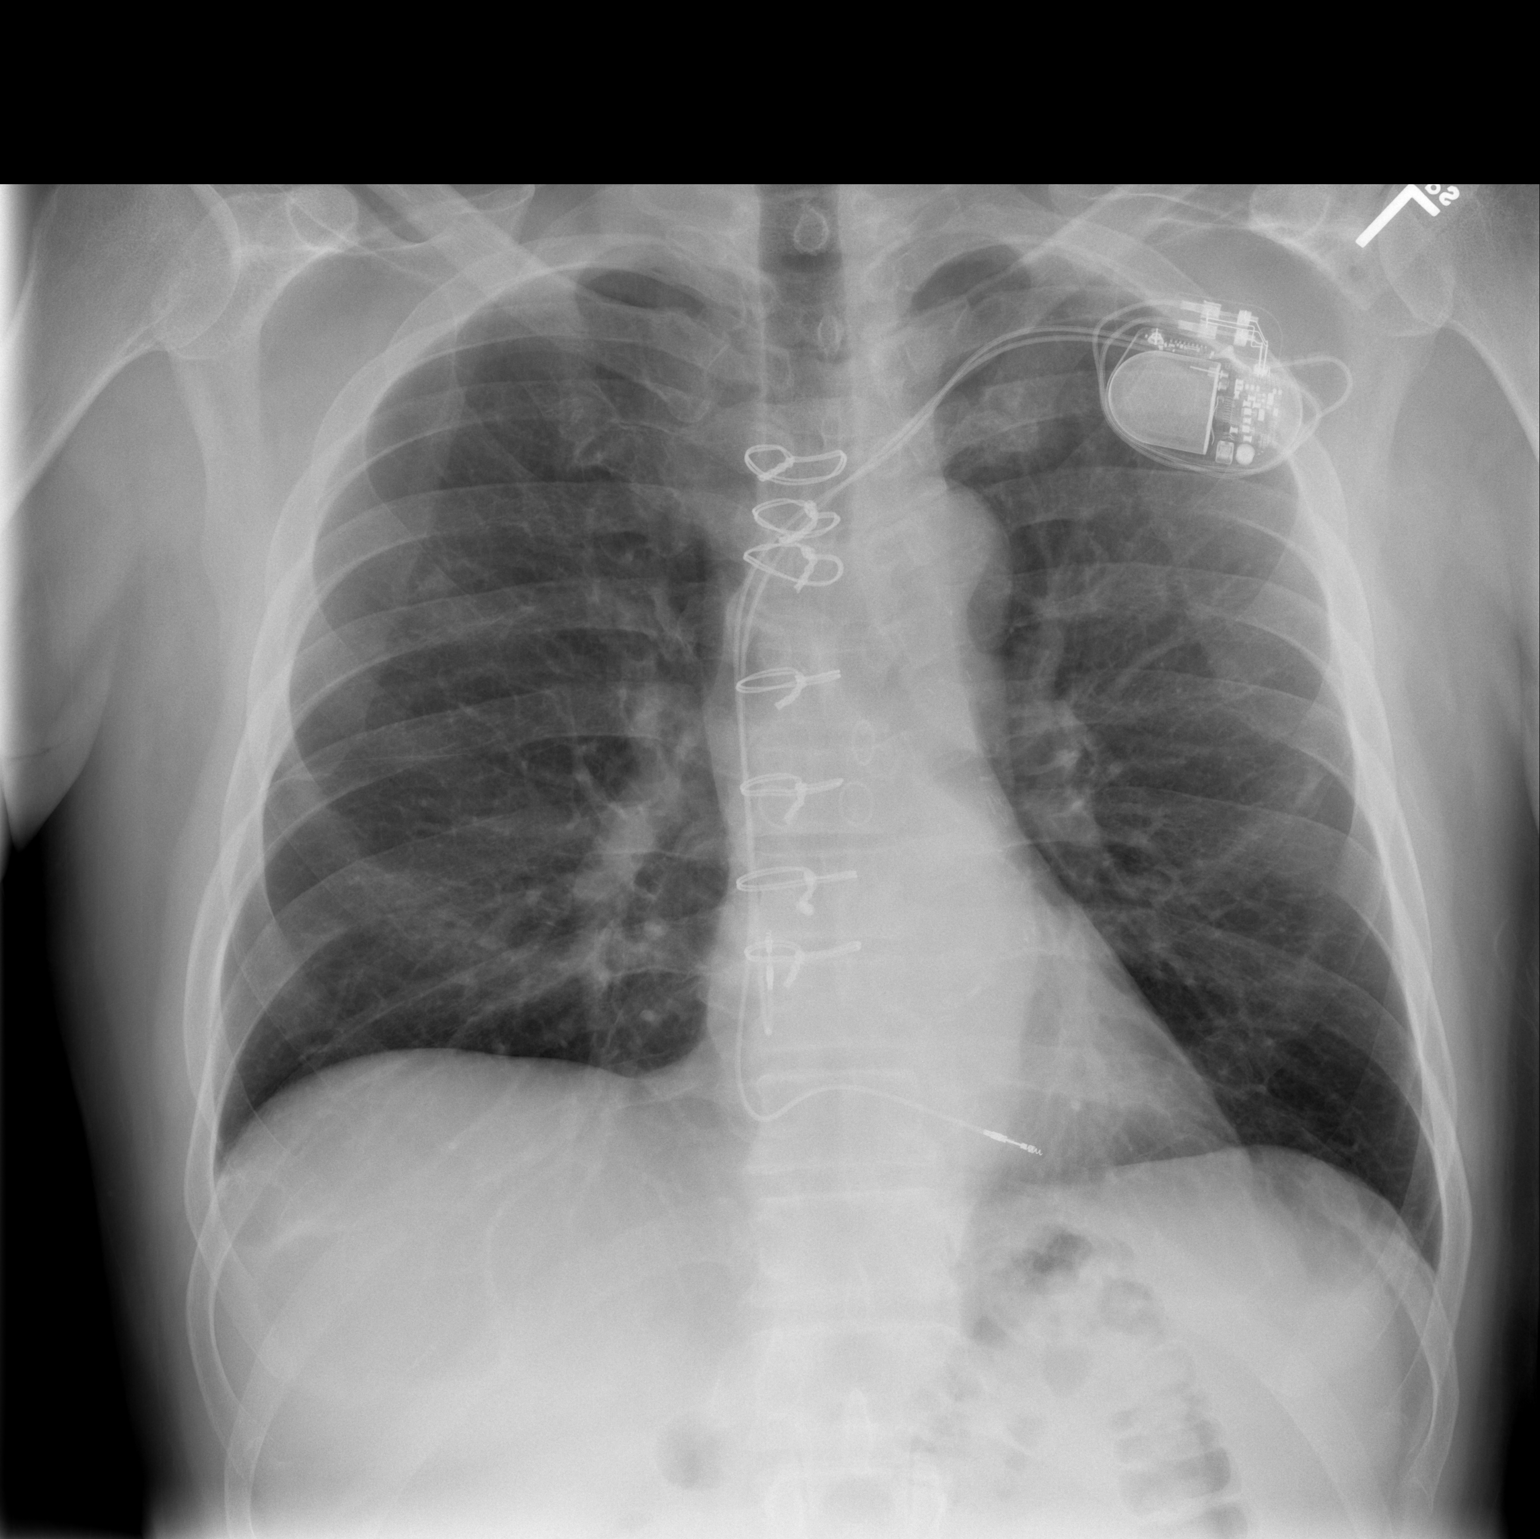

[w chest lat]
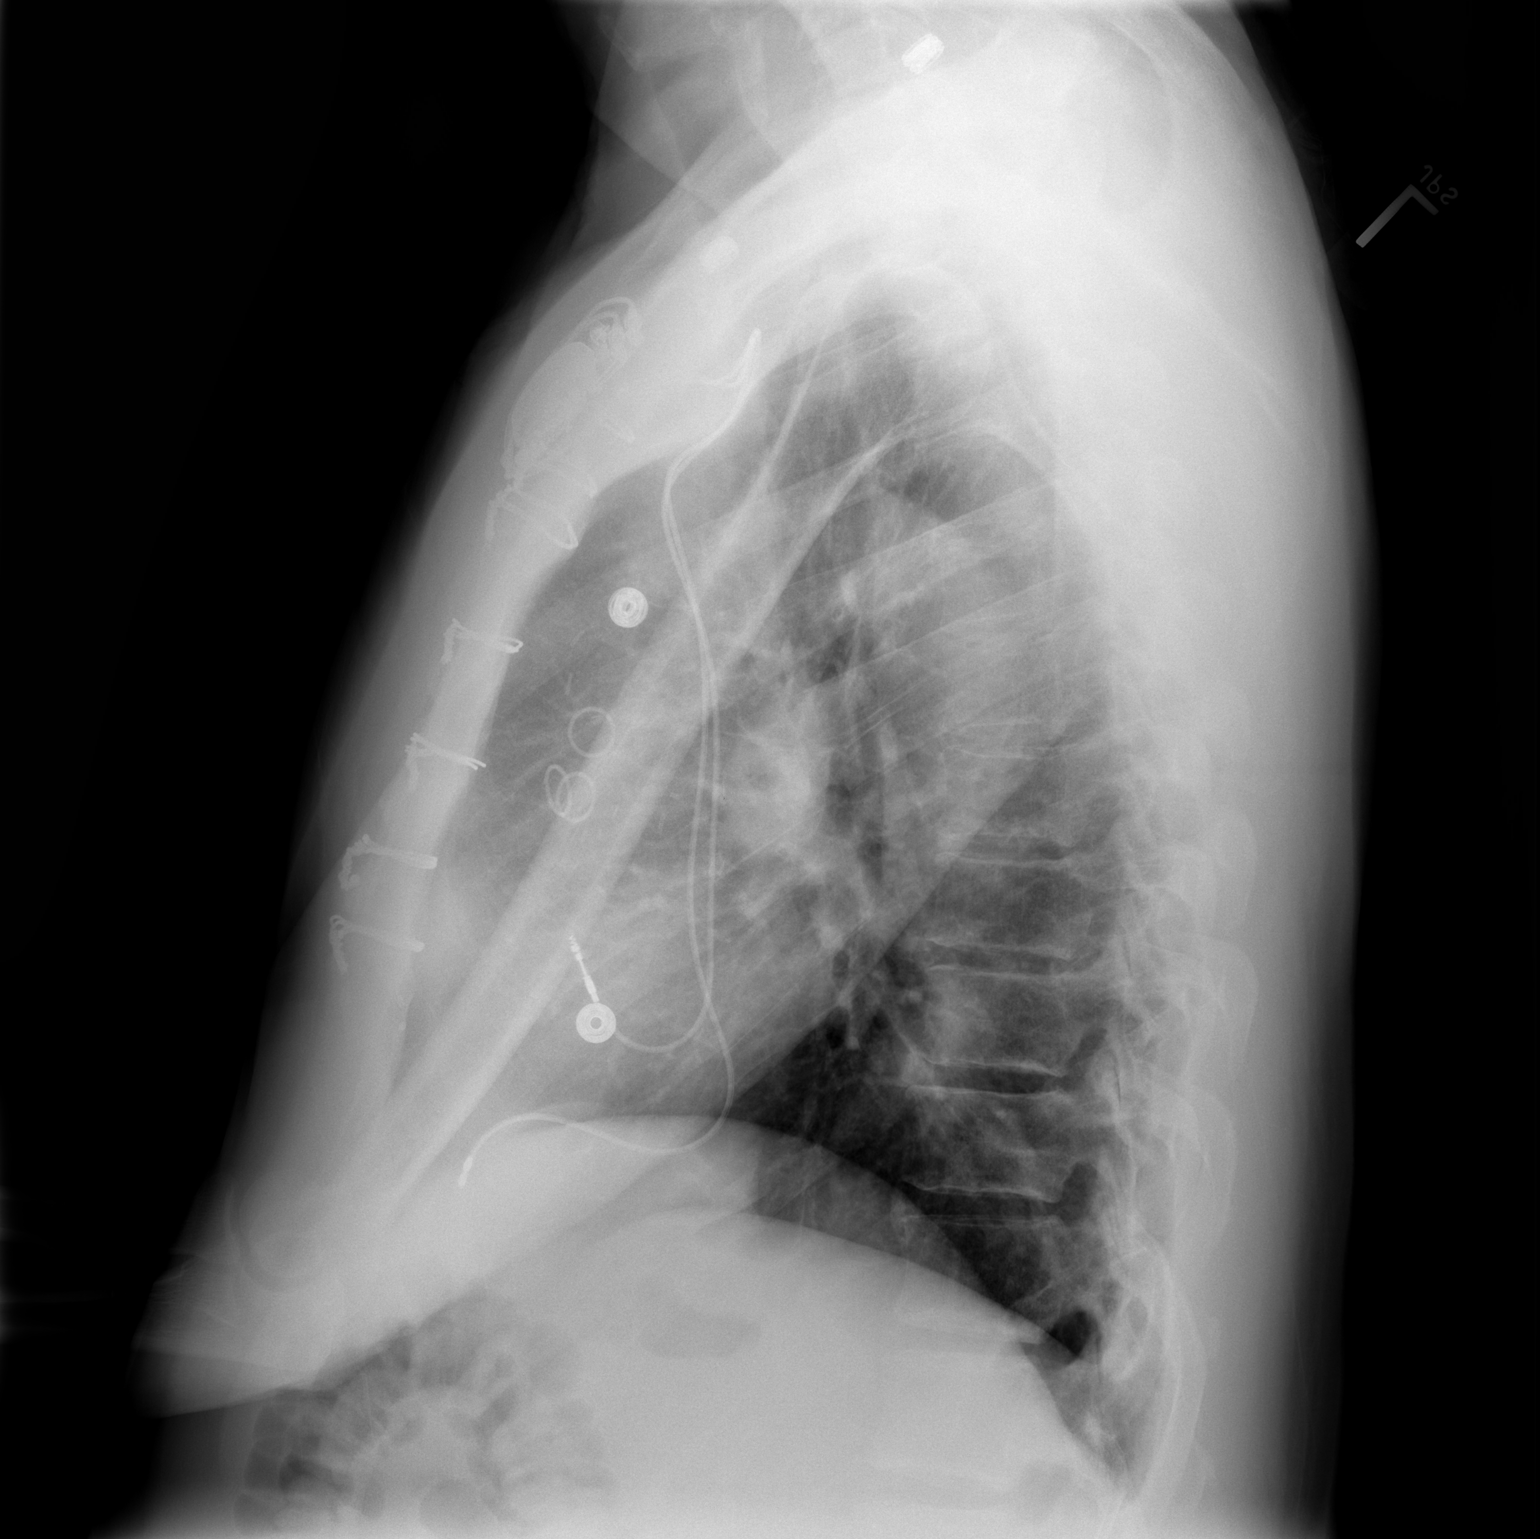

[2 of 2 positions shown; findings below may reference images not displayed]

FINDINGS: Trachea is midline.  Heart size normal.  There has been
interval insertion of a pacemaker, with lead tips project over the
right atrium and right ventricle.  No pneumothorax.  There may be
minimal subsegmental atelectasis at the right costophrenic angle.
Lungs are otherwise clear.  No pleural fluid.
IMPRESSION: Interval pacemaker placement without complicating feature.

## 2012-04-10 ENCOUNTER — Ambulatory Visit: Payer: Self-pay | Admitting: Cardiovascular Disease

## 2012-05-13 ENCOUNTER — Ambulatory Visit (INDEPENDENT_AMBULATORY_CARE_PROVIDER_SITE_OTHER): Payer: Self-pay | Admitting: Cardiovascular Disease

## 2012-05-13 ENCOUNTER — Encounter: Payer: Self-pay | Admitting: Cardiovascular Disease

## 2012-05-13 VITALS — BP 142/90 | HR 81 | Ht 72.0 in | Wt 207.6 lb

## 2012-05-13 DIAGNOSIS — I10 Essential (primary) hypertension: Secondary | ICD-10-CM

## 2012-05-13 MED ORDER — LOSARTAN POTASSIUM 50 MG PO TABS: 50.0000 mg | ORAL_TABLET | Freq: Every day | ORAL | Status: DC

## 2012-05-13 NOTE — Patient Instructions (Addendum)
Start Losartan 50mg  daily.  Your physician recommends that you return for fasting lab work in: 6 months just prior to office visit for a CMET, Lipid Panel, and HgbA1c.  Your physician wants you to follow-up in: 6 months with Dr. Excell Seltzer. You will receive a reminder letter in the mail two months in advance. If you don't receive a letter, please call our office to schedule the follow-up appointment.

## 2012-05-31 ENCOUNTER — Encounter: Payer: Self-pay | Admitting: Cardiovascular Disease

## 2012-05-31 NOTE — Progress Notes (Signed)
HPI:  61 year old gentleman presenting for followup evaluation. The patient has CAD status post CABG. He's also undergone permanent pacemaker placement for high-grade AV block and he is now pacer dependent. He's followed for type 2 diabetes as well.  When I saw him last in April of 2013 he complained of a dry cough and scratchy throat. His ACE inhibitor was discontinued and this has resolved.  He denies chest pain. He continues to have unusual episodes where he feels a wave travel through his body followed by generalized fatigue. He has some dyspnea with exertion without significant change. He complains of generalized fatigue. He denies leg swelling, orthopnea, or PND.  Outpatient Encounter Prescriptions as of 05/13/2012  Medication Sig Dispense Refill  . aspirin 325 MG EC tablet Take 325 mg by mouth daily.        Marland Kitchen ibuprofen (ADVIL,MOTRIN) 200 MG tablet Take 200 mg by mouth every 6 (six) hours as needed.        . metFORMIN (GLUCOPHAGE) 1000 MG tablet Take 1 tablet (1,000 mg total) by mouth 2 (two) times daily with a meal.  180 tablet  3  . metoprolol succinate (TOPROL-XL) 50 MG 24 hr tablet Take 1 tablet (50 mg total) by mouth daily.  90 tablet  3  . nitroGLYCERIN (NITROSTAT) 0.4 MG SL tablet Place 0.4 mg under the tongue every 5 (five) minutes as needed.        . NON FORMULARY 2 (two) times daily. C-INSULIN  -- 2 po bid      . simvastatin (ZOCOR) 20 MG tablet Take 1 tablet (20 mg total) by mouth at bedtime.  90 tablet  3  . losartan (COZAAR) 50 MG tablet Take 1 tablet (50 mg total) by mouth daily.  90 tablet  3  . [DISCONTINUED] CINNAMON PO Take by mouth. Taking 4 daily        No Known Allergies  Past Medical History  Diagnosis Date  . Past heart attack 09/2006    AMI COMPLICATED BY CARDIAC ARREST 09/2006 EMERGENCY PCI/MULTIVESSEL CABG  . Hyperlipidemia   . Mobitz (type) II atrioventricular block     s/p PPM by JA  . SVT/ PSVT/ PAT   . Heart block AV complete   . Type 2 diabetes  mellitus   . MILD COGNITIVE IMPAIRMENT SO STATED     ROS: Negative except as per HPI  BP 142/90  Pulse 81  Ht 6' (1.829 m)  Wt 94.167 kg (207 lb 9.6 oz)  BMI 28.16 kg/m2  PHYSICAL EXAM: Pt is alert and oriented, NAD HEENT: normal Neck: JVP - normal, carotids 2+= without bruits Lungs: CTA bilaterally CV: RRR without murmur or gallop Abd: soft, NT, Positive BS, no hepatomegaly Ext: no C/C/E, distal pulses intact and equal Skin: warm/dry no rash  EKG:  Electronic ventricular pacemaker 70 beats per minute  ASSESSMENT AND PLAN:  1. Coronary artery disease, native vessel. Stable without anginal symptoms. Continue aspirin, beta blocker, and lipid-lowering medication with simvastatin. Followup in 6 months. Will check lab work prior to his followup visit.  2. Hypertension. Considering his diabetes he should be started on an angiotensin receptor blocker. He was intolerant to ACE inhibitors. He will start losartan 50 mg daily.   3. Hyperlipidemia. Patient will continue on low dose simvastatin. Lipids from April 2013 showed cholesterol 162, HDL 30, LDL 93, and triglycerides 161. Lifestyle modification was reviewed.   4. Type 2 diabetes. He continues on metformin 1000 mg twice daily. A hemoglobin A1c  will be checked prior to his return visit.  5. AV block status post permanent pacemaker. Continue followup with Dr. Johney Frame.  Tonny Bollman 05/31/2012

## 2012-07-17 ENCOUNTER — Encounter: Payer: Self-pay | Admitting: Internal Medicine

## 2012-07-17 ENCOUNTER — Ambulatory Visit (INDEPENDENT_AMBULATORY_CARE_PROVIDER_SITE_OTHER): Payer: Self-pay | Admitting: Internal Medicine

## 2012-07-17 VITALS — BP 140/82 | HR 60 | Ht 71.0 in | Wt 213.0 lb

## 2012-07-17 DIAGNOSIS — I471 Supraventricular tachycardia: Secondary | ICD-10-CM

## 2012-07-17 DIAGNOSIS — I10 Essential (primary) hypertension: Secondary | ICD-10-CM

## 2012-07-17 DIAGNOSIS — I442 Atrioventricular block, complete: Secondary | ICD-10-CM

## 2012-07-17 LAB — PACEMAKER DEVICE OBSERVATION
AL AMPLITUDE: 2.1 mv
AL THRESHOLD: 0.75 V
BAMS-0001: 150 {beats}/min
BAMS-0003: 70 {beats}/min
BATTERY VOLTAGE: 2.9478 V
DEVICE MODEL PM: 2619931

## 2012-07-17 NOTE — Assessment & Plan Note (Signed)
Stable No change required today  

## 2012-07-17 NOTE — Assessment & Plan Note (Signed)
Short runs of atrial tachycardia, lasting on 2-6 seconds are observed We will follow this

## 2012-07-17 NOTE — Assessment & Plan Note (Signed)
Normal pacemaker function See Arita Miss Art report  Today, I have turned on the magnet response to record electrograms during symptomatic episodes and have instructed him to place a magnet over the device when his symptoms occur to capture this. He is also given a wireless adapter for merlin transmissions today.

## 2012-07-17 NOTE — Patient Instructions (Addendum)
Your physician wants you to follow-up in: 12 months with Dr Jacquiline Doe will receive a reminder letter in the mail two months in advance. If you don't receive a letter, please call our office to schedule the follow-up appointment.  Remote monitoring is used to monitor your Pacemaker of ICD from home. This monitoring reduces the number of office visits required to check your device to one time per year. It allows [place] to keep an eye on the functioning of your device to ensure it is working properly. You are scheduled for a device check from home on 10/19/2012. You may send your transmission at any time that day. If you have a wireless device, the transmission will be sent automatically. After your physician reviews your transmission, you will receive a postcard with your next transmission date.

## 2012-07-17 NOTE — Progress Notes (Signed)
Primary cardiologist:  Dr Excell Seltzer  The patient presents today for routine electrophysiology followup.  Since last being seen in our clinic, the patient reports doing reasonably well.  He remains active.  He reports having palpitations every 3-4 days.  He feels that his heart "stops" briefly.  This is followed by a "warm sensation" and then he feels washed out.  He is unaware of any triggers/ precipitants.   He denies presyncope or syncope.  Today, he denies symptoms of chest pain, shortness of breath, orthopnea, PND, or lower extremity edema.  The patient feels that he is tolerating medications without difficulties and is otherwise without complaint today.   Past Medical History  Diagnosis Date  . Past heart attack 09/2006    AMI COMPLICATED BY CARDIAC ARREST 09/2006 EMERGENCY PCI/MULTIVESSEL CABG  . Hyperlipidemia   . Mobitz (type) II atrioventricular block     s/p PPM by JA  . SVT/ PSVT/ PAT   . Heart block AV complete   . Type 2 diabetes mellitus   . MILD COGNITIVE IMPAIRMENT SO STATED    Past Surgical History  Procedure Date  . Coronary artery bypass graft 09/2006    FOUR VESSEL  . Pacemaker insertion 07/31/10    by Fawn Kirk (SJM) for Mobtiz II AV block    Current Outpatient Prescriptions  Medication Sig Dispense Refill  . aspirin 325 MG EC tablet Take 325 mg by mouth daily.        Marland Kitchen ibuprofen (ADVIL,MOTRIN) 200 MG tablet Take 200 mg by mouth every 6 (six) hours as needed.        Marland Kitchen losartan (COZAAR) 50 MG tablet Take 1 tablet (50 mg total) by mouth daily.  90 tablet  3  . metFORMIN (GLUCOPHAGE) 1000 MG tablet Take 1 tablet (1,000 mg total) by mouth 2 (two) times daily with a meal.  180 tablet  3  . metoprolol succinate (TOPROL-XL) 50 MG 24 hr tablet Take 1 tablet (50 mg total) by mouth daily.  90 tablet  3  . nitroGLYCERIN (NITROSTAT) 0.4 MG SL tablet Place 0.4 mg under the tongue every 5 (five) minutes as needed.        . NON FORMULARY 2 (two) times daily. C-INSULIN  -- 2 po bid      .  simvastatin (ZOCOR) 20 MG tablet Take 1 tablet (20 mg total) by mouth at bedtime.  90 tablet  3    No Known Allergies  History   Social History  . Marital Status: Single    Spouse Name: N/A    Number of Children: N/A  . Years of Education: N/A   Occupational History  . Not on file.   Social History Main Topics  . Smoking status: Former Smoker    Types: Cigarettes    Quit date: 09/30/2006  . Smokeless tobacco: Not on file  . Alcohol Use: No  . Drug Use: No  . Sexually Active: Not on file   Other Topics Concern  . Not on file   Social History Narrative  . No narrative on file    Physical Exam: Filed Vitals:   07/17/12 1002  BP: 140/82  Pulse: 60  Height: 5\' 11"  (1.803 m)  Weight: 213 lb (96.616 kg)    GEN- The patient is well appearing, alert and oriented x 3 today.   Head- normocephalic, atraumatic Eyes-  Sclera clear, conjunctiva pink Ears- hearing intact Oropharynx- clear Neck- supple, no JVP Lymph- no cervical lymphadenopathy Lungs- Clear to ausculation bilaterally, normal  work of breathing Chest- pacemaker pocket is well healed Heart- Regular rate and rhythm, no murmurs, rubs or gallops, PMI not laterally displaced GI- soft, NT, ND, + BS Extremities- no clubbing, cyanosis, or edema  Pacemaker interrogation- reviewed in detail today,  See PACEART report  Assessment and Plan:

## 2012-09-15 ENCOUNTER — Other Ambulatory Visit: Payer: Self-pay | Admitting: Cardiovascular Disease

## 2012-09-16 NOTE — Telephone Encounter (Signed)
Rx sent to pharmacy   

## 2012-10-19 ENCOUNTER — Ambulatory Visit (INDEPENDENT_AMBULATORY_CARE_PROVIDER_SITE_OTHER): Payer: Self-pay | Admitting: *Deleted

## 2012-10-19 ENCOUNTER — Encounter: Payer: Self-pay | Admitting: Internal Medicine

## 2012-10-19 ENCOUNTER — Other Ambulatory Visit: Payer: Self-pay | Admitting: Internal Medicine

## 2012-10-19 DIAGNOSIS — I442 Atrioventricular block, complete: Secondary | ICD-10-CM

## 2012-10-20 LAB — REMOTE PACEMAKER DEVICE
AL AMPLITUDE: 2.1 mv
AL IMPEDENCE PM: 410 Ohm
AL THRESHOLD: 0.625 V
BAMS-0003: 70 {beats}/min
BATTERY VOLTAGE: 2.95 V
RV LEAD IMPEDENCE PM: 450 Ohm
VENTRICULAR PACING PM: 100

## 2012-11-13 ENCOUNTER — Encounter: Payer: Self-pay | Admitting: *Deleted

## 2013-01-18 ENCOUNTER — Encounter: Payer: Self-pay | Admitting: *Deleted

## 2013-01-19 ENCOUNTER — Encounter: Payer: Self-pay | Admitting: *Deleted

## 2013-01-22 ENCOUNTER — Encounter: Payer: Self-pay | Admitting: Internal Medicine

## 2013-01-22 ENCOUNTER — Ambulatory Visit (INDEPENDENT_AMBULATORY_CARE_PROVIDER_SITE_OTHER): Payer: Self-pay | Admitting: *Deleted

## 2013-01-22 DIAGNOSIS — Z950 Presence of cardiac pacemaker: Secondary | ICD-10-CM

## 2013-01-22 DIAGNOSIS — I442 Atrioventricular block, complete: Secondary | ICD-10-CM

## 2013-01-22 LAB — REMOTE PACEMAKER DEVICE
AL AMPLITUDE: 2.4 mv
ATRIAL PACING PM: 15
BAMS-0001: 150 {beats}/min
BAMS-0003: 70 {beats}/min
BATTERY VOLTAGE: 2.95 V
VENTRICULAR PACING PM: 100

## 2013-04-26 ENCOUNTER — Ambulatory Visit (INDEPENDENT_AMBULATORY_CARE_PROVIDER_SITE_OTHER): Payer: Self-pay | Admitting: *Deleted

## 2013-04-26 DIAGNOSIS — I442 Atrioventricular block, complete: Secondary | ICD-10-CM

## 2013-04-27 ENCOUNTER — Encounter: Payer: Self-pay | Admitting: Internal Medicine

## 2013-04-27 ENCOUNTER — Encounter: Payer: Self-pay | Admitting: *Deleted

## 2013-04-27 DIAGNOSIS — I442 Atrioventricular block, complete: Secondary | ICD-10-CM

## 2013-04-29 LAB — REMOTE PACEMAKER DEVICE
AL AMPLITUDE: 2.7 mv
AL THRESHOLD: 0.5 V
ATRIAL PACING PM: 14
BAMS-0001: 150 {beats}/min
DEVICE MODEL PM: 2619931
RV LEAD IMPEDENCE PM: 450 Ohm
RV LEAD THRESHOLD: 0.875 V

## 2013-05-06 NOTE — Progress Notes (Signed)
Pacer remote received.  

## 2013-05-12 ENCOUNTER — Other Ambulatory Visit: Payer: Self-pay | Admitting: Cardiovascular Disease

## 2013-05-13 ENCOUNTER — Encounter: Payer: Self-pay | Admitting: *Deleted

## 2013-07-21 ENCOUNTER — Encounter: Payer: Self-pay | Admitting: Internal Medicine

## 2013-07-21 ENCOUNTER — Ambulatory Visit (INDEPENDENT_AMBULATORY_CARE_PROVIDER_SITE_OTHER): Payer: Self-pay | Admitting: Internal Medicine

## 2013-07-21 VITALS — BP 148/84 | HR 113 | Ht 71.0 in | Wt 201.2 lb

## 2013-07-21 DIAGNOSIS — I471 Supraventricular tachycardia: Secondary | ICD-10-CM

## 2013-07-21 DIAGNOSIS — I25810 Atherosclerosis of coronary artery bypass graft(s) without angina pectoris: Secondary | ICD-10-CM

## 2013-07-21 DIAGNOSIS — I442 Atrioventricular block, complete: Secondary | ICD-10-CM

## 2013-07-21 LAB — MDC_IDC_ENUM_SESS_TYPE_INCLINIC
Battery Remaining Longevity: 93.6 mo
Battery Voltage: 2.95 V
Brady Statistic RA Percent Paced: 17 %
Brady Statistic RV Percent Paced: 99.95 %
Implantable Pulse Generator Serial Number: 2619931
Lead Channel Impedance Value: 512.5 Ohm
Lead Channel Impedance Value: 512.5 Ohm
Lead Channel Pacing Threshold Amplitude: 0.75 V
Lead Channel Pacing Threshold Pulse Width: 0.5 ms
Lead Channel Pacing Threshold Pulse Width: 0.5 ms
Lead Channel Setting Pacing Amplitude: 1.125
Lead Channel Setting Pacing Amplitude: 1.625
Lead Channel Setting Pacing Pulse Width: 0.5 ms
Lead Channel Setting Sensing Sensitivity: 2.5 mV
MDC IDC MSMT LEADCHNL RA PACING THRESHOLD AMPLITUDE: 0.625 V
MDC IDC MSMT LEADCHNL RA SENSING INTR AMPL: 2.6 mV
MDC IDC SESS DTM: 20150121103123

## 2013-07-21 MED ORDER — NITROGLYCERIN 0.4 MG SL SUBL: 0.4000 mg | SUBLINGUAL_TABLET | SUBLINGUAL | Status: DC | PRN

## 2013-07-21 NOTE — Patient Instructions (Addendum)
Your physician wants you to follow-up in: 12 months with Dr Jacquiline DoeAllred You will receive a reminder letter in the mail two months in advance. If you don't receive a letter, please call our office to schedule the follow-up appointment.   Remote monitoring is used to monitor your Pacemaker or ICD from home. This monitoring reduces the number of office visits required to check your device to one time per year. It allows us to keep an eye on the functioning of your device to ensure it is working properly. You are scheduled for a device check from home on 10/22/13. You may send your transmission at any time that day. If you have a wireless device, the transmission will be sent automatically. After your physician reviews your transmission, you will receive a postcard with your next transmission date.  Needs an appointment with Dr Excell Seltzerooper in April

## 2013-07-21 NOTE — Progress Notes (Signed)
Primary cardiologist:  Dr Excell Seltzer  The patient presents today for routine electrophysiology followup.  Since last being seen in our clinic, the patient reports doing reasonably well.  He remains active.  He continues to have rare palpitations.  He denies presyncope or syncope.  He has occasional sharp, fleeting chest pains.  Today, he denies symptoms of exertional chest pain, shortness of breath, orthopnea, PND, or lower extremity edema.  The patient feels that he is tolerating medications without difficulties and is otherwise without complaint today.   Past Medical History  Diagnosis Date  . Past heart attack 09/2006    AMI COMPLICATED BY CARDIAC ARREST 09/2006 EMERGENCY PCI/MULTIVESSEL CABG  . Hyperlipidemia   . Mobitz (type) II atrioventricular block     s/p PPM by JA  . SVT/ PSVT/ PAT   . Heart block AV complete   . Type 2 diabetes mellitus   . MILD COGNITIVE IMPAIRMENT SO STATED    Past Surgical History  Procedure Laterality Date  . Coronary artery bypass graft  09/2006    FOUR VESSEL  . Pacemaker insertion  07/31/10    by Fawn Kirk (SJM) for Mobtiz II AV block    Current Outpatient Prescriptions  Medication Sig Dispense Refill  . aspirin 325 MG EC tablet Take 325 mg by mouth daily.        Marland Kitchen ibuprofen (ADVIL,MOTRIN) 200 MG tablet Take 200 mg by mouth every 6 (six) hours as needed.        Marland Kitchen losartan (COZAAR) 50 MG tablet TAKE 1 TABLET (50 MG TOTAL) BY MOUTH DAILY.  90 tablet  0  . metFORMIN (GLUCOPHAGE) 1000 MG tablet TAKE 1 TABLET (1,000 MG TOTAL) BY MOUTH 2 (TWO) TIMES DAILY WITH A MEAL.  180 tablet  3  . metoprolol succinate (TOPROL-XL) 50 MG 24 hr tablet TAKE 1 TABLET (50 MG TOTAL) BY MOUTH DAILY.  90 tablet  3  . nitroGLYCERIN (NITROSTAT) 0.4 MG SL tablet Place 1 tablet (0.4 mg total) under the tongue every 5 (five) minutes as needed.  30 tablet  11  . simvastatin (ZOCOR) 20 MG tablet TAKE 1 TABLET (20 MG TOTAL) BY MOUTH AT BEDTIME.  90 tablet  3   No current facility-administered  medications for this visit.    No Known Allergies  History   Social History  . Marital Status: Single    Spouse Name: N/A    Number of Children: N/A  . Years of Education: N/A   Occupational History  . Not on file.   Social History Main Topics  . Smoking status: Former Smoker    Types: Cigarettes    Quit date: 09/30/2006  . Smokeless tobacco: Not on file  . Alcohol Use: No  . Drug Use: No  . Sexual Activity: Not on file   Other Topics Concern  . Not on file   Social History Narrative  . No narrative on file    Physical Exam: Filed Vitals:   07/21/13 0945  BP: 148/84  Pulse: 113  Height: 5\' 11"  (1.803 m)  Weight: 201 lb 3.2 oz (91.264 kg)    GEN- The patient is well appearing, alert and oriented x 3 today.   Head- normocephalic, atraumatic Eyes-  Sclera clear, conjunctiva pink Ears- hearing intact Oropharynx- clear Neck- supple, no JVP Lymph- no cervical lymphadenopathy Lungs- Clear to ausculation bilaterally, normal work of breathing Chest- pacemaker pocket is well healed Heart- Regular rate and rhythm, no murmurs, rubs or gallops, PMI not laterally displaced GI- soft,  NT, ND, + BS Extremities- no clubbing, cyanosis, or edema  Pacemaker interrogation- reviewed in detail today,  See PACEART report  Assessment and Plan:   1. Complete heart block Normal pacemaker function See Pace Art report No changes today  2. CAD Stable No change required today  3. Palpitations He has occasional runs of nonsustained atrial tachycardia V rates are stable during these episodes  Follow-up with Dr Dorena Bodoooper Merlin I will see in a year

## 2013-08-03 ENCOUNTER — Other Ambulatory Visit: Payer: Self-pay | Admitting: Cardiovascular Disease

## 2013-10-11 ENCOUNTER — Other Ambulatory Visit: Payer: Self-pay | Admitting: Cardiovascular Disease

## 2013-10-20 ENCOUNTER — Ambulatory Visit: Payer: Self-pay | Admitting: Cardiovascular Disease

## 2013-10-22 ENCOUNTER — Ambulatory Visit (INDEPENDENT_AMBULATORY_CARE_PROVIDER_SITE_OTHER): Payer: Self-pay | Admitting: *Deleted

## 2013-10-22 ENCOUNTER — Encounter: Payer: Self-pay | Admitting: Internal Medicine

## 2013-10-22 DIAGNOSIS — I442 Atrioventricular block, complete: Secondary | ICD-10-CM

## 2013-10-22 LAB — MDC_IDC_ENUM_SESS_TYPE_REMOTE
Battery Remaining Longevity: 102 mo
Battery Voltage: 2.96 V
Brady Statistic AP VP Percent: 18 %
Brady Statistic AP VS Percent: 1 %
Brady Statistic AS VS Percent: 1 %
Brady Statistic RV Percent Paced: 99 %
Date Time Interrogation Session: 20150424064846
Implantable Pulse Generator Model: 2210
Implantable Pulse Generator Serial Number: 2619931
Lead Channel Impedance Value: 450 Ohm
Lead Channel Pacing Threshold Amplitude: 0.625 V
Lead Channel Pacing Threshold Pulse Width: 0.5 ms
Lead Channel Sensing Intrinsic Amplitude: 2 mV
Lead Channel Sensing Intrinsic Amplitude: 8.3 mV
Lead Channel Setting Pacing Amplitude: 1.125
Lead Channel Setting Pacing Amplitude: 1.625
Lead Channel Setting Pacing Pulse Width: 0.5 ms
MDC IDC MSMT LEADCHNL RV IMPEDANCE VALUE: 490 Ohm
MDC IDC MSMT LEADCHNL RV PACING THRESHOLD AMPLITUDE: 0.875 V
MDC IDC MSMT LEADCHNL RV PACING THRESHOLD PULSEWIDTH: 0.5 ms
MDC IDC SET LEADCHNL RV SENSING SENSITIVITY: 2.5 mV
MDC IDC STAT BRADY AS VP PERCENT: 82 %
MDC IDC STAT BRADY RA PERCENT PACED: 18 %

## 2013-11-02 NOTE — Progress Notes (Signed)
Pacemaker remote check. Device function reviewed. Impedance, sensing, auto capture thresholds consistent with previous measurements. Histograms appropriate for patient and level of activity. All other diagnostic data reviewed and is appropriate and stable for patient. Real time/magnet EGM shows appropriate sensing and capture. 504 mode switches the longest 20 seconds, EGM's show noise on the atrial lead, with a stable impedance, threshold and P-wave.  No ventricular high rate episodes. Estimated longevity 8.5 years. Plan to follow in 3 months remotely, to see in office annually.  Next remote 01/24/14.

## 2014-01-12 ENCOUNTER — Other Ambulatory Visit: Payer: Self-pay | Admitting: Cardiovascular Disease

## 2014-01-13 NOTE — Telephone Encounter (Signed)
Is this ok to refill or should this be sent to pcp? Please advise. Thanks, MI

## 2014-01-13 NOTE — Telephone Encounter (Signed)
Yes, please refill.  We check the pt's HgbA1c.

## 2014-01-24 ENCOUNTER — Ambulatory Visit (INDEPENDENT_AMBULATORY_CARE_PROVIDER_SITE_OTHER): Payer: Self-pay | Admitting: *Deleted

## 2014-01-24 DIAGNOSIS — I442 Atrioventricular block, complete: Secondary | ICD-10-CM

## 2014-01-24 LAB — MDC_IDC_ENUM_SESS_TYPE_REMOTE
Battery Remaining Longevity: 91 mo
Battery Voltage: 2.95 V
Brady Statistic AP VS Percent: 1 %
Brady Statistic RA Percent Paced: 20 %
Brady Statistic RV Percent Paced: 99 %
Date Time Interrogation Session: 20150727071951
Implantable Pulse Generator Model: 2210
Implantable Pulse Generator Serial Number: 2619931
Lead Channel Impedance Value: 450 Ohm
Lead Channel Pacing Threshold Amplitude: 0.625 V
Lead Channel Pacing Threshold Pulse Width: 0.5 ms
Lead Channel Sensing Intrinsic Amplitude: 1 mV
Lead Channel Setting Pacing Amplitude: 1.125
Lead Channel Setting Sensing Sensitivity: 2.5 mV
MDC IDC MSMT BATTERY REMAINING PERCENTAGE: 74 %
MDC IDC MSMT LEADCHNL RA IMPEDANCE VALUE: 410 Ohm
MDC IDC MSMT LEADCHNL RV PACING THRESHOLD AMPLITUDE: 0.875 V
MDC IDC MSMT LEADCHNL RV PACING THRESHOLD PULSEWIDTH: 0.5 ms
MDC IDC MSMT LEADCHNL RV SENSING INTR AMPL: 12 mV
MDC IDC SET LEADCHNL RA PACING AMPLITUDE: 1.625
MDC IDC SET LEADCHNL RV PACING PULSEWIDTH: 0.5 ms
MDC IDC STAT BRADY AP VP PERCENT: 20 %
MDC IDC STAT BRADY AS VP PERCENT: 80 %
MDC IDC STAT BRADY AS VS PERCENT: 1 %

## 2014-01-24 NOTE — Progress Notes (Signed)
Remote pacemaker transmission.   

## 2014-02-03 ENCOUNTER — Encounter: Payer: Self-pay | Admitting: Cardiology

## 2014-02-22 ENCOUNTER — Encounter: Payer: Self-pay | Admitting: Internal Medicine

## 2014-03-11 ENCOUNTER — Ambulatory Visit (INDEPENDENT_AMBULATORY_CARE_PROVIDER_SITE_OTHER): Payer: Self-pay | Admitting: Cardiovascular Disease

## 2014-03-11 ENCOUNTER — Encounter: Payer: Self-pay | Admitting: Cardiovascular Disease

## 2014-03-11 VITALS — BP 150/82 | HR 62 | Ht 71.0 in | Wt 204.0 lb

## 2014-03-11 DIAGNOSIS — I10 Essential (primary) hypertension: Secondary | ICD-10-CM

## 2014-03-11 DIAGNOSIS — I471 Supraventricular tachycardia: Secondary | ICD-10-CM

## 2014-03-11 NOTE — Progress Notes (Signed)
HPI:  63 year old gentleman presenting for followup evaluation. The patient has coronary artery disease status post CABG. He has undergone permanent pacemaker placement for high-grade symptomatic AV block and he is now pacemaker dependent. He is also followed for type 2 diabetes. He initially presented in 2008 with cardiac arrest and he underwent emergency cardiac catheterization, multivessel balloon angioplasty, and high risk emergency CABG. Last assessment of LVEF in 2012 showed normal LV function with an ejection fraction estimated at 60% by ventriculography. He underwent cardiac catheterization demonstrating severe three-vessel coronary artery disease with patent bypass grafts to the PDA, diagonal, obtuse marginal, and LAD.  The patient reports no significant change in symptoms. He's had no recent chest pain or shortness of breath. He does continue to have spells where he feels lightheadedness and even tunnel vision with pre-syncope. These have never correlated with any arrhythmia on his pacemaker interrogation. He experiences palpitations and heart racing. Symptoms are long-standing. Other complaints include memory impairment and generalized fatigue. He has not been checking blood sugars at home.  Outpatient Encounter Prescriptions as of 03/11/2014  Medication Sig  . aspirin 325 MG EC tablet Take 325 mg by mouth daily.    Marland Kitchen ibuprofen (ADVIL,MOTRIN) 200 MG tablet Take 200 mg by mouth every 6 (six) hours as needed.    Marland Kitchen losartan (COZAAR) 50 MG tablet TAKE ONE TABLET BY MOUTH EVERY DAY  . metFORMIN (GLUCOPHAGE) 1000 MG tablet TAKE 1 TABLET (1,000 MG TOTAL) BY MOUTH 2 (TWO) TIMES DAILY WITH A MEAL.  . metoprolol succinate (TOPROL-XL) 50 MG 24 hr tablet TAKE ONE TABLET BY MOUTH EVERY DAY  . Naproxen Sodium 220 MG CAPS Take 3 capsules by mouth 2 (two) times daily.  . nitroGLYCERIN (NITROSTAT) 0.4 MG SL tablet Place 1 tablet (0.4 mg total) under the tongue every 5 (five) minutes as needed.  .  simvastatin (ZOCOR) 20 MG tablet TAKE ONE TABLET BY MOUTH AT BEDTIME    No Known Allergies  Past Medical History  Diagnosis Date  . Past heart attack 09/2006    AMI COMPLICATED BY CARDIAC ARREST 09/2006 EMERGENCY PCI/MULTIVESSEL CABG  . Hyperlipidemia   . Mobitz (type) II atrioventricular block     s/p PPM by JA  . SVT/ PSVT/ PAT   . Heart block AV complete   . Type 2 diabetes mellitus   . MILD COGNITIVE IMPAIRMENT SO STATED     ROS: Negative except as per HPI  BP 150/82  Pulse 62  Ht  (1.803 m)  Wt 204 lb (92.534 kg)  BMI 28.46 kg/m2  PHYSICAL EXAM: Pt is alert and oriented, NAD HEENT: normal Neck: JVP - normal, carotids 2+= without bruits Lungs: CTA bilaterally CV: RRR without murmur or gallop Abd: soft, NT, Positive BS, no hepatomegaly Ext: no C/C/E, distal pulses intact and equal Skin: warm/dry no rash  EKG:  Electronic ventricular pacemaker 62 beats per minute  ASSESSMENT AND PLAN: 1. Coronary artery disease, native vessel. The patient is stable without symptoms of angina. He will continue on his current medications which include aspirin, a statin drug, long-acting beta blocker, and an ARB in the setting of his type 2 diabetes  2. Hypertension. Blood pressure control is suboptimal. He continues on losartan and metoprolol succinate. I worry about increasing his antihypertensives because of his frequent lightheaded episodes. He will record home blood pressures and he now results to [place].  3. Hyperlipidemia. The patient is due for fasting lab work. He continues on simvastatin 20 mg.  4.  Type 2 diabetes, maintained on oral hypoglycemics. We'll check hemoglobin A1c with his upcoming lab work.  Tonny Bollman MD 03/15/2014 11:14 PM

## 2014-03-11 NOTE — Patient Instructions (Signed)
Your physician recommends that you continue on your current medications as directed. Please refer to the Current Medication list given to you today.  Dr Excell Seltzer wants you to check your BP at home every couple of days for the next week and then [facility] [place] the results.  Your physician recommends that you return for lab work on 03/18/14 for FASTING Labs.  Your physician wants you to follow-up in: 6 months with Dr Excell Seltzer.  You will receive a reminder letter in the mail two months in advance. If you don't receive a letter, please call our office to schedule the follow-up appointment.

## 2014-03-15 ENCOUNTER — Encounter: Payer: Self-pay | Admitting: Cardiovascular Disease

## 2014-03-18 ENCOUNTER — Other Ambulatory Visit (INDEPENDENT_AMBULATORY_CARE_PROVIDER_SITE_OTHER): Payer: Self-pay

## 2014-03-18 DIAGNOSIS — I471 Supraventricular tachycardia: Secondary | ICD-10-CM

## 2014-03-18 DIAGNOSIS — I10 Essential (primary) hypertension: Secondary | ICD-10-CM

## 2014-03-18 LAB — COMPREHENSIVE METABOLIC PANEL
ALBUMIN: 4.8 g/dL (ref 3.5–5.2)
ALT: 48 U/L (ref 0–53)
AST: 40 U/L — ABNORMAL HIGH (ref 0–37)
Alkaline Phosphatase: 39 U/L (ref 39–117)
BILIRUBIN TOTAL: 0.8 mg/dL (ref 0.2–1.2)
BUN: 16 mg/dL (ref 6–23)
CO2: 22 mEq/L (ref 19–32)
Calcium: 9.6 mg/dL (ref 8.4–10.5)
Chloride: 101 mEq/L (ref 96–112)
Creatinine, Ser: 1.1 mg/dL (ref 0.4–1.5)
GFR: 74.03 mL/min (ref >60.00)
Glucose, Bld: 211 mg/dL — ABNORMAL HIGH (ref 70–99)
Potassium: 3.9 mEq/L (ref 3.5–5.1)
SODIUM: 136 meq/L (ref 135–145)
Total Protein: 8 g/dL (ref 6.0–8.3)

## 2014-03-18 LAB — CBC
HEMATOCRIT: 44.1 % (ref 39.0–52.0)
HEMOGLOBIN: 15.2 g/dL (ref 13.0–17.0)
MCHC: 34.4 g/dL (ref 30.0–36.0)
MCV: 88.6 fl (ref 78.0–100.0)
Platelets: 162 10*3/uL (ref 150.0–400.0)
RBC: 4.98 Mil/uL (ref 4.22–5.81)
RDW: 13 % (ref 11.5–15.5)
WBC: 5.1 10*3/uL (ref 4.0–10.5)

## 2014-03-18 LAB — HEMOGLOBIN A1C: HEMOGLOBIN A1C: 8.5 % — AB (ref 4.6–6.5)

## 2014-03-18 LAB — TSH: TSH: 1.32 u[IU]/mL (ref 0.35–4.50)

## 2014-03-30 ENCOUNTER — Other Ambulatory Visit: Payer: Self-pay | Admitting: Cardiovascular Disease

## 2014-04-07 ENCOUNTER — Telehealth: Payer: Self-pay

## 2014-04-07 ENCOUNTER — Other Ambulatory Visit: Payer: Self-pay | Admitting: Cardiovascular Disease

## 2014-04-07 MED ORDER — METFORMIN HCL 1000 MG PO TABS: ORAL_TABLET | ORAL | Status: DC

## 2014-04-07 NOTE — Telephone Encounter (Signed)
You can refill Metformin for 6 months.

## 2014-05-16 ENCOUNTER — Ambulatory Visit (INDEPENDENT_AMBULATORY_CARE_PROVIDER_SITE_OTHER): Payer: Self-pay | Admitting: *Deleted

## 2014-05-16 ENCOUNTER — Telehealth: Payer: Self-pay | Admitting: Cardiology

## 2014-05-16 DIAGNOSIS — I442 Atrioventricular block, complete: Secondary | ICD-10-CM

## 2014-05-16 LAB — MDC_IDC_ENUM_SESS_TYPE_REMOTE
Battery Remaining Percentage: 74 %
Brady Statistic AP VP Percent: 20 %
Brady Statistic AP VS Percent: 1 %
Brady Statistic AS VS Percent: 1 %
Brady Statistic RA Percent Paced: 20 %
Brady Statistic RV Percent Paced: 99 %
Implantable Pulse Generator Model: 2210
Implantable Pulse Generator Serial Number: 2619931
Lead Channel Impedance Value: 430 Ohm
Lead Channel Pacing Threshold Amplitude: 0.625 V
Lead Channel Pacing Threshold Amplitude: 0.75 V
Lead Channel Pacing Threshold Pulse Width: 0.5 ms
Lead Channel Sensing Intrinsic Amplitude: 12 mV
Lead Channel Setting Pacing Amplitude: 1.625
Lead Channel Setting Pacing Pulse Width: 0.5 ms
MDC IDC MSMT BATTERY REMAINING LONGEVITY: 90 mo
MDC IDC MSMT BATTERY VOLTAGE: 2.95 V
MDC IDC MSMT LEADCHNL RA SENSING INTR AMPL: 2.1 mV
MDC IDC MSMT LEADCHNL RV IMPEDANCE VALUE: 480 Ohm
MDC IDC MSMT LEADCHNL RV PACING THRESHOLD PULSEWIDTH: 0.5 ms
MDC IDC SESS DTM: 20151116173241
MDC IDC SET LEADCHNL RV PACING AMPLITUDE: 1 V
MDC IDC SET LEADCHNL RV SENSING SENSITIVITY: 2.5 mV
MDC IDC STAT BRADY AS VP PERCENT: 80 %

## 2014-05-16 NOTE — Progress Notes (Signed)
Remote pacemaker transmission.   

## 2014-05-16 NOTE — Telephone Encounter (Signed)
Confirmed remote transmission with pt.

## 2014-05-23 ENCOUNTER — Encounter: Payer: Self-pay | Admitting: Cardiology

## 2014-05-25 ENCOUNTER — Encounter: Payer: Self-pay | Admitting: Internal Medicine

## 2014-07-08 ENCOUNTER — Other Ambulatory Visit: Payer: Self-pay | Admitting: Cardiovascular Disease

## 2014-08-04 ENCOUNTER — Encounter: Payer: Self-pay | Admitting: *Deleted

## 2014-08-10 ENCOUNTER — Encounter: Payer: Self-pay | Admitting: Cardiovascular Disease

## 2014-09-16 ENCOUNTER — Ambulatory Visit (INDEPENDENT_AMBULATORY_CARE_PROVIDER_SITE_OTHER): Payer: Self-pay | Admitting: Cardiovascular Disease

## 2014-09-16 ENCOUNTER — Encounter: Payer: Self-pay | Admitting: Cardiovascular Disease

## 2014-09-16 VITALS — BP 160/100 | HR 65 | Ht 71.0 in | Wt 209.8 lb

## 2014-09-16 DIAGNOSIS — I2510 Atherosclerotic heart disease of native coronary artery without angina pectoris: Secondary | ICD-10-CM

## 2014-09-16 DIAGNOSIS — E785 Hyperlipidemia, unspecified: Secondary | ICD-10-CM

## 2014-09-16 DIAGNOSIS — I10 Essential (primary) hypertension: Secondary | ICD-10-CM

## 2014-09-16 LAB — LIPID PANEL
Cholesterol: 172 mg/dL (ref 0–200)
HDL: 30.8 mg/dL — AB (ref >39.00)
NonHDL: 141.2
Total CHOL/HDL Ratio: 6
Triglycerides: 245 mg/dL — ABNORMAL HIGH (ref 0.0–149.0)
VLDL: 49 mg/dL — AB (ref 0.0–40.0)

## 2014-09-16 LAB — HEPATIC FUNCTION PANEL
ALT: 65 U/L — ABNORMAL HIGH (ref 0–53)
AST: 47 U/L — ABNORMAL HIGH (ref 0–37)
Albumin: 4.7 g/dL (ref 3.5–5.2)
Alkaline Phosphatase: 42 U/L (ref 39–117)
BILIRUBIN TOTAL: 0.6 mg/dL (ref 0.2–1.2)
Bilirubin, Direct: 0.1 mg/dL (ref 0.0–0.3)
TOTAL PROTEIN: 7.6 g/dL (ref 6.0–8.3)

## 2014-09-16 LAB — HEMOGLOBIN A1C: Hgb A1c MFr Bld: 8.7 % — ABNORMAL HIGH (ref 4.6–6.5)

## 2014-09-16 LAB — CBC
HCT: 42.4 % (ref 39.0–52.0)
Hemoglobin: 14.7 g/dL (ref 13.0–17.0)
MCHC: 34.7 g/dL (ref 30.0–36.0)
MCV: 85.7 fl (ref 78.0–100.0)
PLATELETS: 154 10*3/uL (ref 150.0–400.0)
RBC: 4.95 Mil/uL (ref 4.22–5.81)
RDW: 13.9 % (ref 11.5–15.5)
WBC: 4.2 10*3/uL (ref 4.0–10.5)

## 2014-09-16 LAB — BASIC METABOLIC PANEL
BUN: 14 mg/dL (ref 6–23)
CO2: 29 mEq/L (ref 19–32)
CREATININE: 1.07 mg/dL (ref 0.40–1.50)
Calcium: 10 mg/dL (ref 8.4–10.5)
Chloride: 102 mEq/L (ref 96–112)
GFR: 73.91 mL/min (ref >60.00)
Glucose, Bld: 161 mg/dL — ABNORMAL HIGH (ref 70–99)
Potassium: 4.3 mEq/L (ref 3.5–5.1)
SODIUM: 136 meq/L (ref 135–145)

## 2014-09-16 LAB — LDL CHOLESTEROL, DIRECT: LDL DIRECT: 111 mg/dL

## 2014-09-16 MED ORDER — METOPROLOL SUCCINATE ER 50 MG PO TB24
50.0000 mg | ORAL_TABLET | Freq: Every day | ORAL | Status: DC
Start: 2014-09-16 — End: 2014-10-10

## 2014-09-16 NOTE — Progress Notes (Signed)
Cardiology Office Note   Date:  09/16/2014   ID:  Mitchell Daweseter Houdeshell, DOB 1951/02/12, MRN [id number]  PCP:  No primary care provider on file.  Cardiologist:  Tonny BollmanMichael Ebba Goll, MD    Chief Complaint  Patient presents with  . Fatigue    History of Present Illness: Mitchell Blair is a 64 y.o. male who presents for followup evaluation. The patient has coronary artery disease status post CABG. He has undergone permanent pacemaker placement for high-grade symptomatic AV block and he is now pacemaker dependent. He is also followed for type 2 diabetes. He initially presented in 2008 with cardiac arrest and he underwent emergency cardiac catheterization, multivessel balloon angioplasty, and high risk emergency CABG. Last assessment of LVEF in 2012 showed normal LV function with an ejection fraction estimated at 60% by ventriculography. He underwent cardiac catheterization demonstrating severe three-vessel coronary artery disease with patent bypass grafts to the PDA, diagonal, obtuse marginal, and LAD.  The patient has felt very poorly. He stopped all of his medicines except for aspirin and metformin 2 weeks ago. He feels at least "60% better." He was unable to walk any distance. He complained of generalized weakness, lightheadedness, muscle soreness, and near syncope. All of these symptoms have markedly improved. His diabetes is under better control based on his report to me. He continues on metformin 2 times daily. He's had no recent chest pain or shortness of breath.  Past Medical History  Diagnosis Date  . Past heart attack 09/2006    AMI COMPLICATED BY CARDIAC ARREST 09/2006 EMERGENCY PCI/MULTIVESSEL CABG  . Hyperlipidemia   . Mobitz (type) II atrioventricular block     s/p PPM by JA  . SVT/ PSVT/ PAT   . Heart block AV complete   . Type 2 diabetes mellitus   . MILD COGNITIVE IMPAIRMENT SO STATED     Past Surgical History  Procedure Laterality Date  . Coronary artery bypass graft  09/2006   FOUR VESSEL  . Pacemaker insertion  07/31/10    by Fawn KirkJA (SJM) for Mobtiz II AV block    Current Outpatient Prescriptions  Medication Sig Dispense Refill  . aspirin 325 MG EC tablet Take 325 mg by mouth daily.      . metFORMIN (GLUCOPHAGE) 1000 MG tablet TAKE 1 TABLET (1,000 MG TOTAL) BY MOUTH 2 (TWO) TIMES DAILY WITH A MEAL. 180 tablet 2   No current facility-administered medications for this visit.    Allergies:   Review of patient's allergies indicates no known allergies.   Social History:  The patient  reports that he quit smoking about 7 years ago. His smoking use included Cigarettes. He does not have any smokeless tobacco history on file. He reports that he does not drink alcohol or use illicit drugs.   Family History:  The patient's  Family history is unknown by patient.   ROS:  Please see the history of present illness.  Otherwise, review of systems is positive for back pain, muscle pain, dizziness, leg pain, palpitations, headaches.  All other systems are reviewed and negative.   PHYSICAL EXAM: VS:  BP 160/100 mmHg  Pulse 65  Ht 5\' 11"  (1.803 m)  Wt 209 lb 12.8 oz (95.165 kg)  BMI 29.27 kg/m2 , BMI Body mass index is 29.27 kg/(m^2). GEN: Well nourished, well developed, in no acute distress HEENT: normal Neck: no JVD, no masses. No carotid bruits Cardiac: RRR without murmur or gallop  Respiratory:  clear to auscultation bilaterally, normal work of breathing GI: soft, nontender, nondistended, + BS MS: no deformity or atrophy Ext: no pretibial edema, pedal pulses 2+= bilaterally Skin: warm and dry, no rash Neuro:  Strength and sensation are intact Psych: euthymic mood, full affect  EKG:  EKG is ordered today. The ekg ordered today shows Electronic Ventricular Pacemaker 65 bpm  Recent Labs: 03/18/2014: ALT 48; BUN 16; Creatinine 1.1; Hemoglobin 15.2; Platelets 162.0; Potassium 3.9; Sodium 136; TSH 1.32   Lipid Panel     Component Value Date/Time   CHOL  162 10/04/2011 0845   TRIG 311.0* 10/04/2011 0845   HDL 29.90* 10/04/2011 0845   CHOLHDL 5 10/04/2011 0845   VLDL 62.2* 10/04/2011 0845   LDLCALC 57 11/21/2010 0933   LDLDIRECT 93.1 10/04/2011 0845      Wt Readings from Last 3 Encounters:  09/16/14 209 lb 12.8 oz (95.165 kg)  03/11/14 204 lb (92.534 kg)  07/21/13 201 lb 3.2 oz (91.264 kg)    ASSESSMENT AND PLAN: 1.  CAD, native vessel: Stable without symptoms of angina. He will continue on aspirin for antiplatelet therapy.  2. Essential HTN: Blood pressure is uncontrolled now that he is off of all of his antihypertensive medications. He has agreed to start back on metoprolol at his previous dose. Since he was having such severe side effects of his medicines, I would like to start back at just one medicine at a time. I asked him to monitor his blood pressure at least 3 days per week at home and call us if readings are greater than 140/90.  3. Hyperlipidemia: He is statin intolerant. Will check lipids today as he is fasting. He is not interested in any other lipid-lowering medication.  4. Type 2 DM: Will check a hemoglobin A1c. He continues on metformin with suboptimal ICU neck control. He understands that I am not a diabetes specialist. I would like him to see an endocrinologist. I have advised this in the past but he has never followed through.  Current medicines are reviewed with the patient today.  The patient does not have concerns regarding medicines.  The following changes have been made:  Resume metoprolol succinate 50 mg daily.  Labs/ tests ordered today include:  No orders of the defined types were placed in this encounter.   Disposition:   FU 6 months  Signed, Tonny Bollman, MD  09/16/2014 9:29 AM    [facility] [address] [place], [place], [place]  [address] Phone: [phone]; Fax: [phone]

## 2014-09-16 NOTE — Patient Instructions (Addendum)
Your physician has recommended you make the following change in your medication:  1. START Metoprolol Succinate 50mg  take one by mouth daily  Your physician recommends that you have lab work today: LIPID, LIVER, CBC, BMP and HgbA1c  Your physician wants you to follow-up in: 6 MONTHS with Dr Excell Seltzerooper.  You will receive a reminder letter in the mail two months in advance. If you don't receive a letter, please call our office to schedule the follow-up appointment.  Your physician has requested that you regularly monitor and record your blood pressure readings at home. Please use the same machine at the same time of day to check your readings and record them to bring to your follow-up visit. PLEASE contact the office if your BP is consistently above 140/90.   If you would like to establish with Endocrine we would recommend [facility] --Dr Foye Spurlinghristina Gherge  [phone]

## 2014-10-10 ENCOUNTER — Encounter: Payer: Self-pay | Admitting: Internal Medicine

## 2014-10-10 ENCOUNTER — Ambulatory Visit (INDEPENDENT_AMBULATORY_CARE_PROVIDER_SITE_OTHER): Payer: Self-pay | Admitting: Internal Medicine

## 2014-10-10 VITALS — BP 138/76 | HR 87 | Ht 71.0 in | Wt 209.0 lb

## 2014-10-10 DIAGNOSIS — I10 Essential (primary) hypertension: Secondary | ICD-10-CM

## 2014-10-10 DIAGNOSIS — I442 Atrioventricular block, complete: Secondary | ICD-10-CM

## 2014-10-10 DIAGNOSIS — I471 Supraventricular tachycardia: Secondary | ICD-10-CM

## 2014-10-10 LAB — MDC_IDC_ENUM_SESS_TYPE_INCLINIC
Battery Remaining Longevity: 100.8 mo
Brady Statistic RV Percent Paced: 99.9 %
Implantable Pulse Generator Model: 2210
Implantable Pulse Generator Serial Number: 2619931
Lead Channel Impedance Value: 487.5 Ohm
Lead Channel Pacing Threshold Amplitude: 0.625 V
Lead Channel Pacing Threshold Amplitude: 0.875 V
Lead Channel Pacing Threshold Pulse Width: 0.5 ms
Lead Channel Sensing Intrinsic Amplitude: 12 mV
Lead Channel Sensing Intrinsic Amplitude: 2.6 mV
Lead Channel Setting Pacing Amplitude: 1.625
Lead Channel Setting Pacing Pulse Width: 0.5 ms
Lead Channel Setting Sensing Sensitivity: 2.5 mV
MDC IDC MSMT BATTERY VOLTAGE: 2.93 V
MDC IDC MSMT LEADCHNL RA IMPEDANCE VALUE: 425 Ohm
MDC IDC MSMT LEADCHNL RA PACING THRESHOLD PULSEWIDTH: 0.5 ms
MDC IDC SESS DTM: 20160411161000
MDC IDC SET LEADCHNL RV PACING AMPLITUDE: 1.125
MDC IDC STAT BRADY RA PERCENT PACED: 18 %

## 2014-10-10 NOTE — Progress Notes (Signed)
Electrophysiology Office Note   Date:  10/10/2014   ID:  Mitchell Blair, DOB June 23, 1951, MRN [id number]  PCP:  No primary care provider on file.  Cardiologist:  Dr Excell Seltzerooper Primary Electrophysiologist: Hillis RangeJames Daxx Tiggs, MD    Chief Complaint  Patient presents with  . Appointment    fatigue     History of Present Illness: Mitchell Daweseter Kuri is a 64 y.o. male who presents today for electrophysiology evaluation.   He has stopped all of his CV medicines.  Dr Excell Seltzerooper is aware.  He seems to be near his chronic baseline.  His primary concerns are orthopedic.  He has rare palpitations.  Today, he denies symptoms of chest pain, shortness of breath, orthopnea, PND, lower extremity edema, claudication, dizziness, presyncope, syncope, bleeding, or neurologic sequela. The patient is tolerating medications without difficulties and is otherwise without complaint today.    Past Medical History  Diagnosis Date  . Past heart attack 09/2006    AMI COMPLICATED BY CARDIAC ARREST 09/2006 EMERGENCY PCI/MULTIVESSEL CABG  . Hyperlipidemia   . Mobitz (type) II atrioventricular block     s/p PPM by JA  . SVT/ PSVT/ PAT   . Heart block AV complete   . Type 2 diabetes mellitus   . MILD COGNITIVE IMPAIRMENT SO STATED    Past Surgical History  Procedure Laterality Date  . Coronary artery bypass graft  09/2006    FOUR VESSEL  . Pacemaker insertion  07/31/10    by Fawn KirkJA (SJM) for Mobtiz II AV block     Current Outpatient Prescriptions  Medication Sig Dispense Refill  . aspirin 325 MG EC tablet Take 325 mg by mouth daily.      . metFORMIN (GLUCOPHAGE) 1000 MG tablet TAKE 1 TABLET (1,000 MG TOTAL) BY MOUTH 2 (TWO) TIMES DAILY WITH A MEAL. 180 tablet 2   No current facility-administered medications for this visit.    Allergies:   Lisinopril; Losartan; Toprol xl; and Pollen extract   Social History:  The patient  reports that he quit smoking about 8 years ago. His smoking use included Cigarettes. He does not have any  smokeless tobacco history on file. He reports that he does not drink alcohol or use illicit drugs.   ROS:  Please see the history of present illness.   All other systems are reviewed and negative.   PHYSICAL EXAM: VS:  BP 138/76 mmHg  Pulse 87  Ht 5\' 11"  (1.803 m)  Wt 209 lb (94.802 kg)  BMI 29.16 kg/m2 , BMI Body mass index is 29.16 kg/(m^2). GEN: Well nourished, well developed, in no acute distress HEENT: normal Neck: no JVD, carotid bruits, or masses Cardiac: RRR; no murmurs, rubs, or gallops,no edema  Respiratory:  clear to auscultation bilaterally, normal work of breathing GI: soft, nontender, nondistended, + BS MS: walks slowly with a cane Skin: warm and dry, device pocket is well healed Neuro:  Strength and sensation are intact Psych: euthymic mood, full affect  Device interrogation is reviewed today in detail.  See PaceArt for details.   Recent Labs: 03/18/2014: TSH 1.32 09/16/2014: ALT 65*; BUN 14; Creatinine 1.07; Hemoglobin 14.7; Platelets 154.0; Potassium 4.3; Sodium 136    Lipid Panel     Component Value Date/Time   CHOL 172 09/16/2014 0947   TRIG 245.0* 09/16/2014 0947   HDL 30.80* 09/16/2014 0947   CHOLHDL 6 09/16/2014 0947   VLDL 49.0* 09/16/2014 0947   LDLCALC 57 11/21/2010 0933   LDLDIRECT 111.0 09/16/2014 0947  Wt Readings from Last 3 Encounters:  10/10/14 209 lb (94.802 kg)  09/16/14 209 lb 12.8 oz (95.165 kg)  03/11/14 204 lb (92.534 kg)      Other studies Reviewed: Additional studies/ records that were reviewed today include: Dr Excell Seltzer   ASSESSMENT AND PLAN:  1. Complete heart block Normal pacemaker function See Pace Art report No changes today  2. CAD Stable Dr Excell Seltzer is follwoing  3. Palpitations He has occasional runs of nonsustained atrial tachycardia V rates are stable during these episodes  Follow-up with Dr Dorena Bodo Follow-up with EP NP in 1 year  Signed, Hillis Range, MD  10/10/2014 4:04 PM     [facility]  [facility] [address] [place] [address] [phone] (office) [phone] (fax)

## 2014-10-10 NOTE — Patient Instructions (Signed)
Your physician wants you to follow-up in: 12 months with Amber Seiler, NP You will receive a reminder letter in the mail two months in advance. If you don't receive a letter, please call our office to schedule the follow-up appointment.  Remote monitoring is used to monitor your Pacemaker or ICD from home. This monitoring reduces the number of office visits required to check your device to one time per year. It allows us to keep an eye on the functioning of your device to ensure it is working properly. You are scheduled for a device check from home on 01/09/15. You may send your transmission at any time that day. If you have a wireless device, the transmission will be sent automatically. After your physician reviews your transmission, you will receive a postcard with your next transmission date.    

## 2015-01-09 ENCOUNTER — Ambulatory Visit (INDEPENDENT_AMBULATORY_CARE_PROVIDER_SITE_OTHER): Payer: Self-pay | Admitting: *Deleted

## 2015-01-09 ENCOUNTER — Encounter: Payer: Self-pay | Admitting: Internal Medicine

## 2015-01-09 DIAGNOSIS — I442 Atrioventricular block, complete: Secondary | ICD-10-CM

## 2015-01-10 NOTE — Progress Notes (Signed)
Remote pacemaker transmission.   

## 2015-01-12 LAB — CUP PACEART REMOTE DEVICE CHECK
Battery Remaining Longevity: 98 mo
Battery Voltage: 2.93 V
Brady Statistic AP VP Percent: 4.9 %
Brady Statistic AP VS Percent: 1 %
Brady Statistic AS VP Percent: 95 %
Brady Statistic AS VS Percent: 1 %
Brady Statistic RA Percent Paced: 4.9 %
Brady Statistic RV Percent Paced: 99 %
Date Time Interrogation Session: 20160711121308
Lead Channel Impedance Value: 510 Ohm
Lead Channel Pacing Threshold Amplitude: 0.625 V
Lead Channel Pacing Threshold Amplitude: 0.875 V
Lead Channel Sensing Intrinsic Amplitude: 12 mV
Lead Channel Setting Pacing Amplitude: 1.125
Lead Channel Setting Pacing Amplitude: 1.625
Lead Channel Setting Pacing Pulse Width: 0.5 ms
Lead Channel Setting Sensing Sensitivity: 2.5 mV
MDC IDC MSMT BATTERY REMAINING PERCENTAGE: 81 %
MDC IDC MSMT LEADCHNL RA IMPEDANCE VALUE: 430 Ohm
MDC IDC MSMT LEADCHNL RA PACING THRESHOLD PULSEWIDTH: 0.5 ms
MDC IDC MSMT LEADCHNL RA SENSING INTR AMPL: 2.6 mV
MDC IDC MSMT LEADCHNL RV PACING THRESHOLD PULSEWIDTH: 0.5 ms
Pulse Gen Model: 2210
Pulse Gen Serial Number: 2619931

## 2015-02-03 ENCOUNTER — Encounter: Payer: Self-pay | Admitting: Cardiology

## 2015-03-17 ENCOUNTER — Other Ambulatory Visit (INDEPENDENT_AMBULATORY_CARE_PROVIDER_SITE_OTHER): Payer: Self-pay | Admitting: *Deleted

## 2015-03-17 ENCOUNTER — Other Ambulatory Visit: Payer: Self-pay

## 2015-03-17 DIAGNOSIS — E785 Hyperlipidemia, unspecified: Secondary | ICD-10-CM

## 2015-03-17 DIAGNOSIS — I10 Essential (primary) hypertension: Secondary | ICD-10-CM

## 2015-03-17 LAB — CBC
HCT: 47.4 % (ref 39.0–52.0)
Hemoglobin: 16.1 g/dL (ref 13.0–17.0)
MCHC: 34 g/dL (ref 30.0–36.0)
MCV: 87.7 fl (ref 78.0–100.0)
Platelets: 166 10*3/uL (ref 150.0–400.0)
RBC: 5.4 Mil/uL (ref 4.22–5.81)
RDW: 13.5 % (ref 11.5–15.5)
WBC: 6.5 10*3/uL (ref 4.0–10.5)

## 2015-03-17 LAB — HEPATIC FUNCTION PANEL
ALK PHOS: 46 U/L (ref 39–117)
ALT: 59 U/L — ABNORMAL HIGH (ref 0–53)
AST: 34 U/L (ref 0–37)
Albumin: 4.8 g/dL (ref 3.5–5.2)
BILIRUBIN TOTAL: 0.6 mg/dL (ref 0.2–1.2)
Bilirubin, Direct: 0.1 mg/dL (ref 0.0–0.3)
Total Protein: 7.9 g/dL (ref 6.0–8.3)

## 2015-03-17 LAB — LIPID PANEL
CHOLESTEROL: 175 mg/dL (ref 0–200)
HDL: 30.2 mg/dL — AB (ref >39.00)
NonHDL: 144.41
Total CHOL/HDL Ratio: 6
Triglycerides: 349 mg/dL — ABNORMAL HIGH (ref 0.0–149.0)
VLDL: 69.8 mg/dL — AB (ref 0.0–40.0)

## 2015-03-17 LAB — BASIC METABOLIC PANEL
BUN: 14 mg/dL (ref 6–23)
CO2: 27 mEq/L (ref 19–32)
Calcium: 10.4 mg/dL (ref 8.4–10.5)
Chloride: 98 mEq/L (ref 96–112)
Creatinine, Ser: 1.09 mg/dL (ref 0.40–1.50)
GFR: 72.23 mL/min (ref >60.00)
Glucose, Bld: 178 mg/dL — ABNORMAL HIGH (ref 70–99)
POTASSIUM: 3.8 meq/L (ref 3.5–5.1)
SODIUM: 136 meq/L (ref 135–145)

## 2015-03-17 LAB — LDL CHOLESTEROL, DIRECT: Direct LDL: 111 mg/dL

## 2015-03-17 LAB — HEMOGLOBIN A1C: HEMOGLOBIN A1C: 8.7 % — AB (ref 4.6–6.5)

## 2015-03-17 NOTE — Addendum Note (Signed)
Addended by: Tonita Phoenix on: 03/17/2015 09:03 AM   Modules accepted: Orders

## 2015-03-23 ENCOUNTER — Ambulatory Visit: Payer: Self-pay | Admitting: Cardiology

## 2015-03-24 ENCOUNTER — Other Ambulatory Visit: Payer: Self-pay

## 2015-03-29 ENCOUNTER — Encounter: Payer: Self-pay | Admitting: Cardiovascular Disease

## 2015-03-29 ENCOUNTER — Ambulatory Visit: Payer: Self-pay | Admitting: Cardiovascular Disease

## 2015-03-29 ENCOUNTER — Ambulatory Visit (INDEPENDENT_AMBULATORY_CARE_PROVIDER_SITE_OTHER): Payer: Self-pay | Admitting: Cardiovascular Disease

## 2015-03-29 VITALS — BP 150/70 | HR 70 | Ht 71.0 in | Wt 206.0 lb

## 2015-03-29 DIAGNOSIS — I10 Essential (primary) hypertension: Secondary | ICD-10-CM

## 2015-03-29 DIAGNOSIS — I25810 Atherosclerosis of coronary artery bypass graft(s) without angina pectoris: Secondary | ICD-10-CM

## 2015-03-29 DIAGNOSIS — E785 Hyperlipidemia, unspecified: Secondary | ICD-10-CM

## 2015-03-29 NOTE — Progress Notes (Signed)
Cardiology Office Note Date:  03/29/2015   ID:  Mitchell Blair, DOB 1951-06-18, MRN [id number]  PCP:  No primary care provider on file.  Cardiologist:  Sherren Mocha, MD    Chief Complaint  Patient presents with  . Follow-up    6 month     History of Present Illness: Mitchell Blair is a 64 y.o. male who presents for followup evaluation. The patient has coronary artery disease status post CABG. He has undergone permanent pacemaker placement for high-grade symptomatic AV block and he is now pacemaker dependent. He is also followed for type 2 diabetes. He initially presented in 2008 with cardiac arrest and he underwent emergency cardiac catheterization, multivessel balloon angioplasty, and high risk emergency CABG. Last assessment of LVEF in 2012 showed normal LV function with an ejection fraction estimated at 60% by ventriculography. Cardiac catheterization demonstrating severe three-vessel coronary artery disease with patent bypass grafts to the PDA, diagonal, obtuse marginal, and LAD.  The patient has long-standing uncontrolled diabetes and hypertension. He has multiple side effects from medications and at times has completely taken himself off of everything. He was last seen in March of this year and was willing to take at least metformin for his diabetes and metoprolol for his blood pressure.  He continues to feel better off of most of his medicine. He still has some dizziness, chest pain, and gait unsteadiness. He states the symptoms are at least 70% better than when he was on multi drug medical therapy. He has occasional lightheaded and syncopal episodes, but these are lifelong and less frequent than in the past. He denies edema, orthopnea, or PND. He plans to go to [place] for several months in the near future.   Past Medical History  Diagnosis Date  . Past heart attack 09/84    AMI COMPLICATED BY CARDIAC ARREST 09/2006 EMERGENCY PCI/MULTIVESSEL CABG  . Hyperlipidemia   . Mobitz  (type) II atrioventricular block     s/p PPM by JA  . SVT/ PSVT/ PAT   . Heart block AV complete   . Type 2 diabetes mellitus   . MILD COGNITIVE IMPAIRMENT SO STATED     Past Surgical History  Procedure Laterality Date  . Coronary artery bypass graft  09/2006    FOUR VESSEL  . Pacemaker insertion  07/31/10    by Greggory Brandy (SJM) for Mobtiz II AV block    Current Outpatient Prescriptions  Medication Sig Dispense Refill  . aspirin 325 MG EC tablet Take 325 mg by mouth daily.      . metFORMIN (GLUCOPHAGE) 1000 MG tablet TAKE 1 TABLET (1,000 MG TOTAL) BY MOUTH 2 (TWO) TIMES DAILY WITH A MEAL. 180 tablet 2  . metoprolol (LOPRESSOR) 50 MG tablet Take 50 mg by mouth daily.     No current facility-administered medications for this visit.    Allergies:   Lisinopril; Losartan; Toprol xl; and Pollen extract   Social History:  The patient  reports that he quit smoking about 8 years ago. His smoking use included Cigarettes. He does not have any smokeless tobacco history on file. He reports that he does not drink alcohol or use illicit drugs.   Family History:  The patient's family hx is unknown. He was adopted. Family history is unknown by patient.    ROS:  Please see the history of present illness.  Otherwise, review of systems is positive for chest pain, shortness of breath, diarrhea, back pain, muscle pain, rash, dizziness, snoring, anxiety, headaches.  All other systems  are reviewed and negative.    PHYSICAL EXAM: VS:  BP 150/70 mmHg  Pulse 70  Ht $R'5\' 11"'MC$  (1.803 m)  Wt 206 lb (93.441 kg)  BMI 28.74 kg/m2 , BMI Body mass index is 28.74 kg/(m^2). GEN: Well nourished, well developed, in no acute distress HEENT: normal Neck: no JVD, no masses. No carotid bruits Cardiac: RRR without murmur or gallop                Respiratory:  clear to auscultation bilaterally, normal work of breathing GI: soft, nontender, nondistended, + BS MS: no deformity or atrophy Ext: no pretibial edema, pedal pulses  2+= bilaterally Skin: warm and dry, there is a maculopapular rash on the abdomen and both legs, chronic per patient Neuro:  Strength and sensation are intact Psych: euthymic mood, full affect  EKG:  EKG is ordered today. The ekg ordered today shows atrial sensed ventricular paced rhythm with prolonged AV conduction heart rate 70 bpm  Recent Labs: 03/17/2015: ALT 59*; BUN 14; Creatinine, Ser 1.09; Hemoglobin 16.1; Platelets 166.0; Potassium 3.8; Sodium 136   Lipid Panel     Component Value Date/Time   CHOL 175 03/17/2015 0903   TRIG 349.0* 03/17/2015 0903   HDL 30.20* 03/17/2015 0903   CHOLHDL 6 03/17/2015 0903   VLDL 69.8* 03/17/2015 0903   LDLCALC 57 11/21/2010 0933   LDLDIRECT 111.0 03/17/2015 0903      Wt Readings from Last 3 Encounters:  03/29/15 206 lb (93.441 kg)  10/10/14 209 lb (94.802 kg)  09/16/14 209 lb 12.8 oz (95.165 kg)    ASSESSMENT AND PLAN: 1.  CAD, native vessel: The patient is stable without symptoms of angina. He is tolerating aspirin without any bleeding problems. He will follow-up in 6 months.  2. Hypertension, uncontrolled: He is tolerating metoprolol succinate and his blood pressure has improved since he restarted it. He is not interested in taking any additional medication. He states that he feels terrible when his blood pressure is in the normal range. He understands the long-term complications.  3. Hyperlipidemia: The patient is statin intolerant. Triglycerides are elevated reflecting his uncontrolled diabetes. Again, not interested in pharmacotherapy  4. Type 2 diabetes: Hemoglobin A1c unchanged at 8.7. He takes metformin. I referred him to endocrinology but he did not follow through. We had another frank discussion about his diabetes. He understands his risks of developing progressive kidney disease, heart disease, and neuropathy. He will continue his current medication. We also discussed consideration of a low carbohydrate diet since he is not going to  take any additional medicine.  Current medicines are reviewed with the patient today.  The patient does not have concerns regarding medicines.  Labs/ tests ordered today include:   Orders Placed This Encounter  Procedures  . CBC  . Comp Met (CMET)  . Direct LDL  . Lipid Profile  . HgB A1c  . EKG 12-Lead    Disposition:   FU 6 months with labs prior to that visit  Signed, Sherren Mocha, MD  03/29/2015 5:30 PM    [address], [place], [place]  [address] Phone: [phone]; Fax: [phone]

## 2015-03-29 NOTE — Patient Instructions (Signed)
Medication Instructions:  None  Labwork: CBC, CMET, Lipid, LDL Direct, A1C prior to follow up appointment  Testing/Procedures: None  Follow-Up: Your physician wants you to follow-up in: 6 months with Dr. Excell Seltzer. You will receive a reminder letter in the mail two months in advance. If you don't receive a letter, please call our office to schedule the follow-up appointment.   Any Other Special Instructions Will Be Listed Below (If Applicable).

## 2015-03-30 ENCOUNTER — Other Ambulatory Visit: Payer: Self-pay | Admitting: Cardiovascular Disease

## 2015-03-30 NOTE — Telephone Encounter (Signed)
This is fine please refill for the patient

## 2015-04-10 ENCOUNTER — Ambulatory Visit (INDEPENDENT_AMBULATORY_CARE_PROVIDER_SITE_OTHER): Payer: Self-pay | Admitting: *Deleted

## 2015-04-10 ENCOUNTER — Encounter: Payer: Self-pay | Admitting: Internal Medicine

## 2015-04-10 ENCOUNTER — Telehealth: Payer: Self-pay | Admitting: Cardiology

## 2015-04-10 DIAGNOSIS — I442 Atrioventricular block, complete: Secondary | ICD-10-CM

## 2015-04-10 NOTE — Telephone Encounter (Signed)
Confirmed remote transmission w/ pt wife.   

## 2015-04-11 NOTE — Progress Notes (Signed)
Remote pacemaker transmission.   

## 2015-04-18 LAB — CUP PACEART REMOTE DEVICE CHECK
Battery Remaining Longevity: 98 mo
Brady Statistic RA Percent Paced: 4.1 %
Implantable Lead Location: 753859
Implantable Lead Location: 753860
Lead Channel Impedance Value: 330 Ohm
Lead Channel Pacing Threshold Amplitude: 0.625 V
Lead Channel Pacing Threshold Pulse Width: 0.5 ms
Lead Channel Sensing Intrinsic Amplitude: 2.4 mV
Lead Channel Setting Sensing Sensitivity: 2.5 mV
MDC IDC LEAD IMPLANT DT: 20120131
MDC IDC LEAD IMPLANT DT: 20120131
MDC IDC MSMT BATTERY REMAINING PERCENTAGE: 81 %
MDC IDC MSMT BATTERY VOLTAGE: 2.93 V
MDC IDC MSMT LEADCHNL RV IMPEDANCE VALUE: 540 Ohm
MDC IDC MSMT LEADCHNL RV PACING THRESHOLD AMPLITUDE: 0.875 V
MDC IDC MSMT LEADCHNL RV PACING THRESHOLD PULSEWIDTH: 0.5 ms
MDC IDC MSMT LEADCHNL RV SENSING INTR AMPL: 12 mV
MDC IDC SESS DTM: 20161010182837
MDC IDC SET LEADCHNL RA PACING AMPLITUDE: 1.625
MDC IDC SET LEADCHNL RV PACING AMPLITUDE: 1.125
MDC IDC SET LEADCHNL RV PACING PULSEWIDTH: 0.5 ms
MDC IDC STAT BRADY AP VP PERCENT: 4.1 %
MDC IDC STAT BRADY AP VS PERCENT: 1 %
MDC IDC STAT BRADY AS VP PERCENT: 96 %
MDC IDC STAT BRADY AS VS PERCENT: 1 %
MDC IDC STAT BRADY RV PERCENT PACED: 99 %
Pulse Gen Model: 2210
Pulse Gen Serial Number: 2619931

## 2015-05-11 ENCOUNTER — Encounter: Payer: Self-pay | Admitting: *Deleted

## 2015-07-10 ENCOUNTER — Telehealth: Payer: Self-pay | Admitting: Cardiology

## 2015-07-10 ENCOUNTER — Ambulatory Visit (INDEPENDENT_AMBULATORY_CARE_PROVIDER_SITE_OTHER): Payer: Self-pay | Admitting: *Deleted

## 2015-07-10 DIAGNOSIS — I442 Atrioventricular block, complete: Secondary | ICD-10-CM

## 2015-07-10 NOTE — Telephone Encounter (Signed)
LMOVM reminding pt to send remote transmission.   

## 2015-07-12 NOTE — Progress Notes (Signed)
Remote pacemaker transmission.   

## 2015-07-30 LAB — CUP PACEART REMOTE DEVICE CHECK
Battery Remaining Percentage: 73 %
Battery Voltage: 2.92 V
Brady Statistic AP VP Percent: 7.3 %
Brady Statistic AS VS Percent: 1 %
Brady Statistic RA Percent Paced: 7.2 %
Date Time Interrogation Session: 20170111043412
Implantable Lead Implant Date: 20120131
Implantable Lead Location: 753859
Implantable Lead Location: 753860
Lead Channel Impedance Value: 510 Ohm
Lead Channel Pacing Threshold Amplitude: 0.625 V
Lead Channel Pacing Threshold Pulse Width: 0.5 ms
Lead Channel Pacing Threshold Pulse Width: 0.5 ms
Lead Channel Sensing Intrinsic Amplitude: 1.9 mV
Lead Channel Setting Pacing Amplitude: 1.375
MDC IDC LEAD IMPLANT DT: 20120131
MDC IDC MSMT BATTERY REMAINING LONGEVITY: 88 mo
MDC IDC MSMT LEADCHNL RA IMPEDANCE VALUE: 360 Ohm
MDC IDC MSMT LEADCHNL RV PACING THRESHOLD AMPLITUDE: 1.125 V
MDC IDC MSMT LEADCHNL RV SENSING INTR AMPL: 12 mV
MDC IDC PG SERIAL: 2619931
MDC IDC SET LEADCHNL RA PACING AMPLITUDE: 1.625
MDC IDC SET LEADCHNL RV PACING PULSEWIDTH: 0.5 ms
MDC IDC SET LEADCHNL RV SENSING SENSITIVITY: 2.5 mV
MDC IDC STAT BRADY AP VS PERCENT: 1 %
MDC IDC STAT BRADY AS VP PERCENT: 93 %
MDC IDC STAT BRADY RV PERCENT PACED: 99 %

## 2015-08-02 ENCOUNTER — Encounter: Payer: Self-pay | Admitting: Cardiology

## 2015-09-25 ENCOUNTER — Ambulatory Visit (INDEPENDENT_AMBULATORY_CARE_PROVIDER_SITE_OTHER): Payer: Self-pay | Admitting: Internal Medicine

## 2015-09-25 ENCOUNTER — Ambulatory Visit (INDEPENDENT_AMBULATORY_CARE_PROVIDER_SITE_OTHER): Payer: Self-pay | Admitting: Cardiovascular Disease

## 2015-09-25 ENCOUNTER — Encounter: Payer: Self-pay | Admitting: Internal Medicine

## 2015-09-25 ENCOUNTER — Encounter: Payer: Self-pay | Admitting: Cardiovascular Disease

## 2015-09-25 VITALS — BP 152/80 | HR 63 | Ht 71.0 in | Wt 200.0 lb

## 2015-09-25 VITALS — BP 160/90 | HR 60 | Ht 71.0 in | Wt 200.1 lb

## 2015-09-25 DIAGNOSIS — I442 Atrioventricular block, complete: Secondary | ICD-10-CM

## 2015-09-25 DIAGNOSIS — I25118 Atherosclerotic heart disease of native coronary artery with other forms of angina pectoris: Secondary | ICD-10-CM

## 2015-09-25 LAB — COMPREHENSIVE METABOLIC PANEL
ALK PHOS: 42 U/L (ref 40–115)
ALT: 39 U/L (ref 9–46)
AST: 29 U/L (ref 10–35)
Albumin: 4.5 g/dL (ref 3.6–5.1)
BUN: 15 mg/dL (ref 7–25)
CALCIUM: 10.1 mg/dL (ref 8.6–10.3)
CHLORIDE: 100 mmol/L (ref 98–110)
CO2: 26 mmol/L (ref 20–31)
Creat: 1.06 mg/dL (ref 0.70–1.25)
GLUCOSE: 167 mg/dL — AB (ref 65–99)
POTASSIUM: 4 mmol/L (ref 3.5–5.3)
Sodium: 137 mmol/L (ref 135–146)
TOTAL PROTEIN: 7.4 g/dL (ref 6.1–8.1)
Total Bilirubin: 0.7 mg/dL (ref 0.2–1.2)

## 2015-09-25 LAB — CUP PACEART INCLINIC DEVICE CHECK
Date Time Interrogation Session: 20170327155224
Implantable Lead Implant Date: 20120131
Implantable Lead Location: 753860
Lead Channel Setting Pacing Pulse Width: 0.5 ms
MDC IDC LEAD IMPLANT DT: 20120131
MDC IDC LEAD LOCATION: 753859
MDC IDC SET LEADCHNL RA PACING AMPLITUDE: 1.625
MDC IDC SET LEADCHNL RV PACING AMPLITUDE: 1.375
MDC IDC SET LEADCHNL RV SENSING SENSITIVITY: 2.5 mV
Pulse Gen Model: 2210
Pulse Gen Serial Number: 2619931

## 2015-09-25 LAB — CBC
HCT: 43.4 % (ref 39.0–52.0)
Hemoglobin: 14.9 g/dL (ref 13.0–17.0)
MCH: 29.6 pg (ref 26.0–34.0)
MCHC: 34.3 g/dL (ref 30.0–36.0)
MCV: 86.1 fL (ref 78.0–100.0)
MPV: 12.4 fL (ref 8.6–12.4)
PLATELETS: 180 10*3/uL (ref 150–400)
RBC: 5.04 MIL/uL (ref 4.22–5.81)
RDW: 13.5 % (ref 11.5–15.5)
WBC: 5.1 10*3/uL (ref 4.0–10.5)

## 2015-09-25 LAB — HEMOGLOBIN A1C
HEMOGLOBIN A1C: 8.7 % — AB (ref <5.7)
Mean Plasma Glucose: 203 mg/dL

## 2015-09-25 LAB — LIPID PANEL
CHOLESTEROL: 170 mg/dL (ref 125–200)
HDL: 29 mg/dL — AB (ref >=40)
LDL CALC: 89 mg/dL (ref <130)
TRIGLYCERIDES: 260 mg/dL — AB (ref <150)
Total CHOL/HDL Ratio: 5.9 Ratio — ABNORMAL HIGH (ref <=5.0)
VLDL: 52 mg/dL — AB (ref <30)

## 2015-09-25 MED ORDER — METOPROLOL SUCCINATE ER 50 MG PO TB24: 50.0000 mg | ORAL_TABLET | Freq: Every day | ORAL | Status: AC

## 2015-09-25 NOTE — Progress Notes (Signed)
Cardiology Office Note Date:  09/25/2015   ID:  Mitchell Blair, DOB [birth year], MRN [id number]  PCP:  No primary care provider on file.  Cardiologist:  Tonny Bollmanooper, Lalia Loudon, MD    Chief Complaint  Patient presents with  . Atrial Fibrillation    c/o sob, cp, claudication. denies any le edema  . Coronary Artery Disease  . Hypertension    History of Present Illness: Mitchell Blair is a 65 y.o. male who presents for followup evaluation. The patient has coronary artery disease status post CABG. He has undergone permanent pacemaker placement for high-grade symptomatic AV block and he is now pacemaker dependent. He is also followed for type 2 diabetes. He initially presented in 2008 with cardiac arrest and he underwent emergency cardiac catheterization, multivessel balloon angioplasty, and high risk emergency CABG. Last cardiac catheterization demonstrated severe three-vessel coronary artery disease with patent bypass grafts to the PDA, diagonal, obtuse marginal, and LAD. LVEF was normal (60%) by ventriculography at that time  He's had long-standing poorly controlled diabetes and hypertension with multiple drug intolerances. He's taken himself off of medicines in the past. He's doing fairly well. [place] [place]. He wishes to come back once a year for a checkup. He doesn't plan on establishing with a doctor in [place].  He does have occasional chest pain with exertion but no recent change. He also has exertional dyspnea with moderate level activity. He doesn't feel limited and he has been able to do hard physical work around his house to get ready to go up for sale. No syncope. No orthopnea or PND.  Past Medical History  Diagnosis Date  . Past heart attack 09/2006    AMI COMPLICATED BY CARDIAC ARREST 09/2006 EMERGENCY PCI/MULTIVESSEL CABG  . Hyperlipidemia   . Mobitz (type) II atrioventricular block     s/p PPM by JA  . SVT/ PSVT/ PAT   . Heart  block AV complete (HCC)   . Type 2 diabetes mellitus (HCC)   . MILD COGNITIVE IMPAIRMENT SO STATED     Past Surgical History  Procedure Laterality Date  . Coronary artery bypass graft  09/2006    FOUR VESSEL  . Pacemaker insertion  07/31/10    by Fawn KirkJA (SJM) for Mobtiz II AV block    Current Outpatient Prescriptions  Medication Sig Dispense Refill  . aspirin 325 MG EC tablet Take 325 mg by mouth daily.      Marland Kitchen. ibuprofen (ADVIL,MOTRIN) 200 MG tablet Take 200 mg by mouth every 6 (six) hours as needed for mild pain.    . metFORMIN (GLUCOPHAGE) 1000 MG tablet TAKE 1 TABLET (1,000 MG TOTAL) BY MOUTH 2 (TWO) TIMES DAILY WITH A MEAL. 180 tablet 2  . metoprolol (LOPRESSOR) 50 MG tablet Take 50 mg by mouth daily.     No current facility-administered medications for this visit.    Allergies:   Lisinopril; Losartan; Toprol xl; and Pollen extract  Social History:  The patient  reports that he quit smoking about 8 years ago. His smoking use included Cigarettes. He does not have any smokeless tobacco history on file. He reports that he does not drink alcohol or use illicit drugs.   Family History:  The patient's  He was adopted. Family history is unknown by patient.   ROS:  Please see the history of present illness.  Otherwise, review of systems is positive for Back pain, muscle pain, dizziness, chest pressure, leg  pain, palpitations, balance problems, headaches.  All other systems are reviewed and negative.   PHYSICAL EXAM: VS:  BP 160/90 mmHg  Pulse 60  Ht  (1.803 m)  Wt 200 lb 1.9 oz (90.774 kg)  BMI 27.92 kg/m2 , BMI Body mass index is 27.92 kg/(m^2). GEN: Well nourished, well developed, in no acute distress HEENT: normal Neck: no JVD, no masses. No carotid bruits Cardiac: RRR without murmur or gallop                Respiratory:  clear to auscultation bilaterally, normal work of breathing GI: soft, nontender, nondistended, + BS MS: no deformity or atrophy Ext: no pretibial edema,  pedal pulses 2+= bilaterally Skin: warm and dry, no rash Neuro:  Strength and sensation are intact Psych: euthymic mood, full affect  EKG:  EKG is ordered today. The ekg ordered today shows electronic ventricular pacemaker 63 bpm  Recent Labs: 03/17/2015: ALT 59*; BUN 14; Creatinine, Ser 1.09; Hemoglobin 16.1; Platelets 166.0; Potassium 3.8; Sodium 136   Lipid Panel     Component Value Date/Time   CHOL 175 03/17/2015 0903   TRIG 349.0* 03/17/2015 0903   HDL 30.20* 03/17/2015 0903   CHOLHDL 6 03/17/2015 0903   VLDL 69.8* 03/17/2015 0903   LDLCALC 57 11/21/2010 0933   LDLDIRECT 111.0 03/17/2015 0903      Wt Readings from Last 3 Encounters:  09/25/15 200 lb 1.9 oz (90.774 kg)  03/29/15 206 lb (93.441 kg)  10/10/14 209 lb (94.802 kg)     ASSESSMENT AND PLAN: 1.  CAD, native vessel, with CCS class II angina: The patient will continue on aspirin. His metoprolol will be changed to metoprolol succinate 50 mg daily. He is not interested in escalating his antianginal program. He will return in one year for follow-up.  2. Hypertension, uncontrolled: Continue metoprolol but change to long-acting form. Reviewed sodium restriction.  3. Hyperlipidemia: Patient is not interested in medical therapy. He is statin intolerant. He will work on lifestyle modification.  4. Type 2 diabetes: He is treated with metformin 1000 mg twice daily. We'll check a hemoglobin A1c today. I have advised him in the past to see a primary care physician or endocrinologist, but he has never followed through with this. I will check his renal function today and update his prescription for the next year.  Current medicines are reviewed with the patient today.  The patient does not have concerns regarding medicines.  Labs/ tests ordered today include:  No orders of the defined types were placed in this encounter.    Disposition:   FU as needed  Signed, Tonny Bollman, MD  09/25/2015 9:20 AM    [facility] [address] [place], [place], [place]  [address] Phone: [phone]; Fax: [phone]  \

## 2015-09-25 NOTE — Patient Instructions (Addendum)
Medication Instructions:  Your physician recommends that you continue on your current medications as directed. Please refer to the Current Medication list given to you today.   Labwork: None ordered   Testing/Procedures: None ordered   Follow-Up: Your physician wants you to follow-up in: 12 months with Dr Johney FrameAllred same day as Dr Excell Seltzerooper appointment You will receive a reminder letter in the mail two months in advance. If you don't receive a letter, please call our office to schedule the follow-up appointment.  Remote monitoring is used to monitor your Pacemaker  from home. This monitoring reduces the number of office visits required to check your device to one time per year. It allows us to keep an eye on the functioning of your device to ensure it is working properly. You are scheduled for a device check from home on 12/25/15. You may send your transmission at any time that day. If you have a wireless device, the transmission will be sent automatically. After your physician reviews your transmission, you will receive a postcard with your next transmission date.     Any Other Special Instructions Will Be Listed Below (If Applicable).     If you need a refill on your cardiac medications before your next appointment, please call your pharmacy.

## 2015-09-25 NOTE — Progress Notes (Signed)
Electrophysiology Office Note   Date:  09/25/2015   ID:  Mitchell Blair, DOB [birth year], MRN [id number]  PCP:  No primary care provider on file.  Cardiologist:  Dr Excell Seltzerooper Primary Electrophysiologist: Hillis RangeJames Domingo Fuson, MD    Chief Complaint  Patient presents with  . Follow-up     History of Present Illness: Mitchell Blair is a 65 y.o. male who presents today for electrophysiology evaluation.  Doing well at this time.  He is moving to [place] soon. Today, he denies symptoms of chest pain, shortness of breath, orthopnea, PND, lower extremity edema, claudication, dizziness, presyncope, syncope, bleeding, or neurologic sequela. The patient is tolerating medications without difficulties and is otherwise without complaint today.    Past Medical History  Diagnosis Date  . Past heart attack 09/2006    AMI COMPLICATED BY CARDIAC ARREST 09/2006 EMERGENCY PCI/MULTIVESSEL CABG  . Hyperlipidemia   . Mobitz (type) II atrioventricular block     s/p PPM by JA  . SVT/ PSVT/ PAT   . Heart block AV complete (HCC)   . Type 2 diabetes mellitus (HCC)   . MILD COGNITIVE IMPAIRMENT SO STATED    Past Surgical History  Procedure Laterality Date  . Coronary artery bypass graft  09/2006    FOUR VESSEL  . Pacemaker insertion  07/31/10    by Fawn KirkJA (SJM) for Mobtiz II AV block     Current Outpatient Prescriptions  Medication Sig Dispense Refill  . aspirin 325 MG EC tablet Take 325 mg by mouth daily.      Marland Kitchen. ibuprofen (ADVIL,MOTRIN) 200 MG tablet Take 200 mg by mouth every 6 (six) hours as needed for mild pain.    . metFORMIN (GLUCOPHAGE) 1000 MG tablet TAKE 1 TABLET (1,000 MG TOTAL) BY MOUTH 2 (TWO) TIMES DAILY WITH A MEAL. 180 tablet 2  . metoprolol succinate (TOPROL-XL) 50 MG 24 hr tablet Take 1 tablet (50 mg total) by mouth daily. Take with or immediately following a meal. 90 tablet 3   No current facility-administered medications for this visit.    Allergies:   Lisinopril; Losartan; Toprol xl; and Pollen  extract   Social History:  The patient  reports that he quit smoking about 8 years ago. His smoking use included Cigarettes. He does not have any smokeless tobacco history on file. He reports that he does not drink alcohol or use illicit drugs.   ROS:  Please see the history of present illness.   All other systems are reviewed and negative.   PHYSICAL EXAM: VS:  BP 152/80 mmHg  Pulse 63  Ht 5\' 11"  (1.803 m)  Wt 200 lb (90.719 kg)  BMI 27.91 kg/m2 , BMI Body mass index is 27.91 kg/(m^2). GEN: Well nourished, well developed, in no acute distress HEENT: normal Neck: no JVD, carotid bruits, or masses Cardiac: RRR; no murmurs, rubs, or gallops,no edema  Respiratory:  clear to auscultation bilaterally, normal work of breathing GI: soft, nontender, nondistended, + BS MS: walks slowly with a cane Skin: warm and dry, device pocket is well healed Neuro:  Strength and sensation are intact Psych: euthymic mood, full affect  Device interrogation is reviewed today in detail.  See PaceArt for details.   Recent Labs: 03/17/2015: ALT 59*; BUN 14; Creatinine, Ser 1.09; Hemoglobin 16.1; Platelets 166.0; Potassium 3.8; Sodium 136    Lipid Panel     Component Value Date/Time   CHOL 175 03/17/2015 0903   TRIG 349.0* 03/17/2015 0903   HDL 30.20* 03/17/2015 0903   CHOLHDL  6 03/17/2015 0903   VLDL 69.8* 03/17/2015 0903   LDLCALC 57 11/21/2010 0933   LDLDIRECT 111.0 03/17/2015 0903     Wt Readings from Last 3 Encounters:  09/25/15 200 lb (90.719 kg)  09/25/15 200 lb 1.9 oz (90.774 kg)  03/29/15 206 lb (93.441 kg)      Other studies Reviewed: Additional studies/ records that were reviewed today include: Dr Excell Seltzer   ASSESSMENT AND PLAN:  1. Complete heart block Normal pacemaker function Some atrial noise is noted though impedance/ sensing/ thresholds are stable.  No indication for lead revision at this time. See Pace Art report Turned VIP off today  2. CAD Stable Dr Excell Seltzer is  follwoing  He wishes to follow-up with Dr Excell Seltzer in 1 year (will drive from [place]) Merlin Follow-up with EP NP in 1 year on same day as visit with Dr Excell Seltzer  Signed, Hillis Range, MD  09/25/2015 10:20 AM     [facility] [address] [place] [address] [phone] (office) [phone] (fax)

## 2015-09-25 NOTE — Patient Instructions (Signed)
Medication Instructions:  Your physician has recommended you make the following change in your medication:  1. STOP Metoprolol Tartrate 2. START Metoprolol Succinate 50mg  take one tablet by mouth daily  Labwork: Your physician recommends that you have lab work today and in 1 YEAR: LIPID, CMP, HgbA1c and CBC  Testing/Procedures: No new orders.   Follow-Up: Your physician wants you to follow-up in: 1 YEAR with Dr Excell Seltzerooper.  You will receive a reminder letter in the mail two months in advance. If you don't receive a letter, please call our office to schedule the follow-up appointment.   Any Other Special Instructions Will Be Listed Below (If Applicable).     If you need a refill on your cardiac medications before your next appointment, please call your pharmacy.

## 2015-09-26 ENCOUNTER — Telehealth: Payer: Self-pay | Admitting: *Deleted

## 2015-09-26 NOTE — Telephone Encounter (Signed)
FYI: Per patients office visit yesterday he was taking metoprolol tartrate and was to start taking metoprolol succinate. Patient called and stated that he has been taking metoprolol succinate all along.The metoprolol succinate was removed from his med list on 10/10/14 at his office visit with Dr Johney FrameAllred with a discontinue reason of side effects, but he stated that he has continued to take it all along without any problems.

## 2015-09-27 NOTE — Telephone Encounter (Signed)
Medication list is correct at this time.

## 2015-10-02 ENCOUNTER — Encounter: Payer: Self-pay | Admitting: Cardiovascular Disease

## 2015-10-02 NOTE — Telephone Encounter (Signed)
New message ° ° ° ° ° ° °Calling to get lab results °

## 2015-10-02 NOTE — Telephone Encounter (Signed)
This encounter was created in error - please disregard.

## 2015-12-14 ENCOUNTER — Other Ambulatory Visit: Payer: Self-pay | Admitting: Cardiovascular Disease

## 2015-12-22 ENCOUNTER — Other Ambulatory Visit: Payer: Self-pay | Admitting: Cardiovascular Disease

## 2015-12-25 ENCOUNTER — Telehealth: Payer: Self-pay | Admitting: Internal Medicine

## 2015-12-25 ENCOUNTER — Ambulatory Visit (INDEPENDENT_AMBULATORY_CARE_PROVIDER_SITE_OTHER): Payer: Self-pay | Admitting: *Deleted

## 2015-12-25 ENCOUNTER — Telehealth: Payer: Self-pay | Admitting: Cardiology

## 2015-12-25 DIAGNOSIS — I442 Atrioventricular block, complete: Secondary | ICD-10-CM

## 2015-12-25 NOTE — Telephone Encounter (Signed)
New message     FYI the device is at home and the significant other is going to take the device to the pt and the transmission should come through on Thursday and Friday cause the pt is on vacation.

## 2015-12-25 NOTE — Telephone Encounter (Signed)
LMOVM reminding pt to send remote transmission.   

## 2015-12-25 NOTE — Telephone Encounter (Signed)
Noted  

## 2015-12-29 NOTE — Progress Notes (Signed)
Remote pacemaker transmission.   

## 2016-01-05 ENCOUNTER — Encounter: Payer: Self-pay | Admitting: Cardiology

## 2016-01-17 LAB — CUP PACEART REMOTE DEVICE CHECK
Battery Remaining Percentage: 73 %
Battery Voltage: 2.92 V
Brady Statistic AP VP Percent: 5.7 %
Brady Statistic AS VP Percent: 94 %
Brady Statistic AS VS Percent: 1 %
Brady Statistic RA Percent Paced: 5.7 %
Brady Statistic RV Percent Paced: 99 %
Implantable Lead Implant Date: 20120131
Implantable Lead Implant Date: 20120131
Implantable Lead Location: 753860
Lead Channel Impedance Value: 540 Ohm
Lead Channel Pacing Threshold Amplitude: 0.625 V
Lead Channel Pacing Threshold Pulse Width: 0.5 ms
Lead Channel Sensing Intrinsic Amplitude: 2.5 mV
Lead Channel Sensing Intrinsic Amplitude: 9.3 mV
Lead Channel Setting Pacing Amplitude: 1.125
Lead Channel Setting Pacing Amplitude: 1.625
Lead Channel Setting Pacing Pulse Width: 0.5 ms
Lead Channel Setting Sensing Sensitivity: 2.5 mV
MDC IDC LEAD LOCATION: 753859
MDC IDC MSMT BATTERY REMAINING LONGEVITY: 89 mo
MDC IDC MSMT LEADCHNL RA IMPEDANCE VALUE: 440 Ohm
MDC IDC MSMT LEADCHNL RA PACING THRESHOLD PULSEWIDTH: 0.5 ms
MDC IDC MSMT LEADCHNL RV PACING THRESHOLD AMPLITUDE: 0.875 V
MDC IDC PG SERIAL: 2619931
MDC IDC SESS DTM: 20170629171437
MDC IDC STAT BRADY AP VS PERCENT: 1 %

## 2016-04-01 ENCOUNTER — Ambulatory Visit (INDEPENDENT_AMBULATORY_CARE_PROVIDER_SITE_OTHER): Payer: Self-pay | Admitting: *Deleted

## 2016-04-01 DIAGNOSIS — I442 Atrioventricular block, complete: Secondary | ICD-10-CM

## 2016-04-01 NOTE — Progress Notes (Signed)
Remote pacemaker transmission.   

## 2016-04-03 ENCOUNTER — Encounter: Payer: Self-pay | Admitting: Cardiology

## 2016-04-09 LAB — CUP PACEART REMOTE DEVICE CHECK
Brady Statistic AS VP Percent: 93 %
Brady Statistic RA Percent Paced: 6.9 %
Brady Statistic RV Percent Paced: 99 %
Date Time Interrogation Session: 20171002103421
Implantable Lead Implant Date: 20120131
Implantable Lead Location: 753859
Lead Channel Pacing Threshold Pulse Width: 0.5 ms
Lead Channel Sensing Intrinsic Amplitude: 9.3 mV
Lead Channel Setting Pacing Amplitude: 1.625
Lead Channel Setting Sensing Sensitivity: 2.5 mV
MDC IDC LEAD IMPLANT DT: 20120131
MDC IDC LEAD LOCATION: 753860
MDC IDC MSMT BATTERY REMAINING LONGEVITY: 86 mo
MDC IDC MSMT BATTERY REMAINING PERCENTAGE: 73 %
MDC IDC MSMT BATTERY VOLTAGE: 2.92 V
MDC IDC MSMT LEADCHNL RA IMPEDANCE VALUE: 430 Ohm
MDC IDC MSMT LEADCHNL RA PACING THRESHOLD AMPLITUDE: 0.625 V
MDC IDC MSMT LEADCHNL RA SENSING INTR AMPL: 3.3 mV
MDC IDC MSMT LEADCHNL RV IMPEDANCE VALUE: 530 Ohm
MDC IDC MSMT LEADCHNL RV PACING THRESHOLD AMPLITUDE: 2.75 V
MDC IDC MSMT LEADCHNL RV PACING THRESHOLD PULSEWIDTH: 0.5 ms
MDC IDC SET LEADCHNL RV PACING AMPLITUDE: 3 V
MDC IDC SET LEADCHNL RV PACING PULSEWIDTH: 0.5 ms
MDC IDC STAT BRADY AP VP PERCENT: 6.9 %
MDC IDC STAT BRADY AP VS PERCENT: 1 %
MDC IDC STAT BRADY AS VS PERCENT: 1 %
Pulse Gen Model: 2210
Pulse Gen Serial Number: 2619931

## 2016-07-02 ENCOUNTER — Telehealth: Payer: Self-pay | Admitting: Cardiology

## 2016-07-02 ENCOUNTER — Encounter: Payer: Self-pay | Admitting: *Deleted

## 2016-07-02 NOTE — Telephone Encounter (Signed)
Attempted to confirm remote transmission with pt. No answer and was unable to leave a message.   

## 2016-07-03 ENCOUNTER — Telehealth: Payer: Self-pay

## 2016-07-03 NOTE — Telephone Encounter (Signed)
Left Voicemail for pt to send remote transmission.

## 2016-07-05 ENCOUNTER — Encounter: Payer: Self-pay | Admitting: Cardiology

## 2016-08-22 ENCOUNTER — Encounter: Payer: Self-pay | Admitting: Cardiology

## 2018-04-22 NOTE — Progress Notes (Signed)
This encounter was created in error - please disregard.

## 2018-11-05 ENCOUNTER — Telehealth: Payer: Self-pay | Admitting: Cardiology

## 2018-11-05 NOTE — Telephone Encounter (Signed)
LMOVM for pt to return call. Pt is overdue for follow up. Did patient move to [facility] and is he seeing a new EP? If not pt needs to make an appt w/ JA and send a remote transmission. Pt may need a new monitor and cell adapter b/c his monitor has not updated since 2017.

## 2019-07-10 ENCOUNTER — Encounter: Payer: Self-pay | Admitting: Cardiology

## 2021-01-11 ENCOUNTER — Telehealth: Payer: Self-pay

## 2021-01-11 NOTE — Telephone Encounter (Signed)
Spoke with patient significant other and she states they have been living in [facility] for 3 years now and he has a cardiologist that checks his PPM and she states we can take him out of our system.

## 2023-03-02 DEATH — deceased
